# Patient Record
Sex: Male | Born: 1955 | Race: Black or African American | Hispanic: No | Marital: Single | Smoking: Current every day smoker
Health system: Southern US, Community
[De-identification: ages and names within clinical notes are randomized; demographics above are authoritative.]

## PROBLEM LIST (undated history)

## (undated) DIAGNOSIS — I10 Essential (primary) hypertension: Secondary | ICD-10-CM

## (undated) DIAGNOSIS — F0390 Unspecified dementia without behavioral disturbance: Secondary | ICD-10-CM

## (undated) DIAGNOSIS — G4730 Sleep apnea, unspecified: Secondary | ICD-10-CM

## (undated) DIAGNOSIS — F209 Schizophrenia, unspecified: Secondary | ICD-10-CM

## (undated) DIAGNOSIS — E039 Hypothyroidism, unspecified: Secondary | ICD-10-CM

## (undated) DIAGNOSIS — F79 Unspecified intellectual disabilities: Secondary | ICD-10-CM

---

## 2008-04-08 ENCOUNTER — Emergency Department: Payer: Self-pay | Admitting: Emergency Medicine

## 2010-03-21 ENCOUNTER — Emergency Department: Payer: Self-pay | Admitting: Emergency Medicine

## 2012-11-14 ENCOUNTER — Emergency Department: Payer: Self-pay | Admitting: Emergency Medicine

## 2012-11-14 LAB — DRUG SCREEN, URINE
Barbiturates, Ur Screen: NEGATIVE (ref ?–200)
Benzodiazepine, Ur Scrn: NEGATIVE (ref ?–200)
Cannabinoid 50 Ng, Ur ~~LOC~~: NEGATIVE (ref ?–50)
Cocaine Metabolite,Ur ~~LOC~~: NEGATIVE (ref ?–300)
Methadone, Ur Screen: NEGATIVE (ref ?–300)
Opiate, Ur Screen: NEGATIVE (ref ?–300)
Phencyclidine (PCP) Ur S: NEGATIVE (ref ?–25)
Tricyclic, Ur Screen: POSITIVE (ref ?–1000)

## 2012-11-14 LAB — TSH: Thyroid Stimulating Horm: 0.1 u[IU]/mL — ABNORMAL LOW

## 2012-11-14 LAB — CBC
MCH: 28.4 pg (ref 26.0–34.0)
MCHC: 33.8 g/dL (ref 32.0–36.0)
MCV: 84 fL (ref 80–100)
Platelet: 200 10*3/uL (ref 150–440)

## 2012-11-14 LAB — COMPREHENSIVE METABOLIC PANEL
Alkaline Phosphatase: 114 U/L (ref 50–136)
Anion Gap: 4 — ABNORMAL LOW (ref 7–16)
Chloride: 106 mmol/L (ref 98–107)
Co2: 29 mmol/L (ref 21–32)
Creatinine: 1.16 mg/dL (ref 0.60–1.30)
EGFR (African American): >60
Osmolality: 278 (ref 275–301)
Potassium: 3.6 mmol/L (ref 3.5–5.1)
Sodium: 139 mmol/L (ref 136–145)
Total Protein: 8.6 g/dL — ABNORMAL HIGH (ref 6.4–8.2)

## 2012-11-14 LAB — ETHANOL
Ethanol %: <0.003 % (ref 0.000–0.080)
Ethanol: <3 mg/dL
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
## 2015-03-21 ENCOUNTER — Emergency Department: Payer: Medicare Other

## 2015-03-21 ENCOUNTER — Encounter: Payer: Self-pay | Admitting: Emergency Medicine

## 2015-03-21 ENCOUNTER — Inpatient Hospital Stay
Admission: EM | Admit: 2015-03-21 | Discharge: 2015-03-24 | DRG: 812 | Payer: Medicare Other | Attending: Internal Medicine | Admitting: Internal Medicine

## 2015-03-21 DIAGNOSIS — F0390 Unspecified dementia without behavioral disturbance: Secondary | ICD-10-CM | POA: Diagnosis present

## 2015-03-21 DIAGNOSIS — K921 Melena: Secondary | ICD-10-CM | POA: Diagnosis present

## 2015-03-21 DIAGNOSIS — G4730 Sleep apnea, unspecified: Secondary | ICD-10-CM | POA: Diagnosis present

## 2015-03-21 DIAGNOSIS — F79 Unspecified intellectual disabilities: Secondary | ICD-10-CM | POA: Diagnosis present

## 2015-03-21 DIAGNOSIS — E861 Hypovolemia: Secondary | ICD-10-CM | POA: Diagnosis present

## 2015-03-21 DIAGNOSIS — Z8249 Family history of ischemic heart disease and other diseases of the circulatory system: Secondary | ICD-10-CM | POA: Diagnosis not present

## 2015-03-21 DIAGNOSIS — K625 Hemorrhage of anus and rectum: Secondary | ICD-10-CM | POA: Diagnosis present

## 2015-03-21 DIAGNOSIS — I10 Essential (primary) hypertension: Secondary | ICD-10-CM | POA: Diagnosis present

## 2015-03-21 DIAGNOSIS — I959 Hypotension, unspecified: Secondary | ICD-10-CM | POA: Diagnosis present

## 2015-03-21 DIAGNOSIS — R55 Syncope and collapse: Secondary | ICD-10-CM | POA: Diagnosis present

## 2015-03-21 DIAGNOSIS — F17210 Nicotine dependence, cigarettes, uncomplicated: Secondary | ICD-10-CM | POA: Diagnosis present

## 2015-03-21 DIAGNOSIS — E039 Hypothyroidism, unspecified: Secondary | ICD-10-CM | POA: Diagnosis present

## 2015-03-21 DIAGNOSIS — D62 Acute posthemorrhagic anemia: Secondary | ICD-10-CM | POA: Diagnosis present

## 2015-03-21 DIAGNOSIS — Z79899 Other long term (current) drug therapy: Secondary | ICD-10-CM

## 2015-03-21 HISTORY — DX: Essential (primary) hypertension: I10

## 2015-03-21 HISTORY — DX: Sleep apnea, unspecified: G47.30

## 2015-03-21 HISTORY — DX: Schizophrenia, unspecified: F20.9

## 2015-03-21 HISTORY — DX: Unspecified dementia, unspecified severity, without behavioral disturbance, psychotic disturbance, mood disturbance, and anxiety: F03.90

## 2015-03-21 HISTORY — DX: Hypothyroidism, unspecified: E03.9

## 2015-03-21 HISTORY — DX: Unspecified intellectual disabilities: F79

## 2015-03-21 LAB — COMPREHENSIVE METABOLIC PANEL
ALBUMIN: 2.9 g/dL — AB (ref 3.5–5.0)
ALT: 32 U/L (ref 17–63)
AST: 31 U/L (ref 15–41)
Alkaline Phosphatase: 49 U/L (ref 38–126)
Anion gap: 5 (ref 5–15)
BUN: 35 mg/dL — AB (ref 6–20)
CHLORIDE: 111 mmol/L (ref 101–111)
CO2: 28 mmol/L (ref 22–32)
CREATININE: 1.29 mg/dL — AB (ref 0.61–1.24)
Calcium: 8.4 mg/dL — ABNORMAL LOW (ref 8.9–10.3)
GFR calc Af Amer: >60 mL/min (ref >60)
GFR, EST NON AFRICAN AMERICAN: 59 mL/min — AB (ref >60)
Glucose, Bld: 136 mg/dL — ABNORMAL HIGH (ref 65–99)
Potassium: 3.5 mmol/L (ref 3.5–5.1)
SODIUM: 144 mmol/L (ref 135–145)
Total Bilirubin: 0.5 mg/dL (ref 0.3–1.2)
Total Protein: 5.5 g/dL — ABNORMAL LOW (ref 6.5–8.1)

## 2015-03-21 LAB — CBC
HCT: 23.4 % — ABNORMAL LOW (ref 40.0–52.0)
HEMOGLOBIN: 7.7 g/dL — AB (ref 13.0–18.0)
MCH: 28.5 pg (ref 26.0–34.0)
MCHC: 33.1 g/dL (ref 32.0–36.0)
MCV: 86.1 fL (ref 80.0–100.0)
Platelets: 136 10*3/uL — ABNORMAL LOW (ref 150–440)
RBC: 2.71 MIL/uL — ABNORMAL LOW (ref 4.40–5.90)
RDW: 12.5 % (ref 11.5–14.5)
WBC: 10.7 10*3/uL — ABNORMAL HIGH (ref 3.8–10.6)

## 2015-03-21 LAB — PROTIME-INR
INR: 1.24
Prothrombin Time: 15.8 seconds — ABNORMAL HIGH (ref 11.4–15.0)

## 2015-03-21 LAB — APTT: aPTT: 27 seconds (ref 24–36)

## 2015-03-21 LAB — ABO/RH: ABO/RH(D): B POS

## 2015-03-21 LAB — PREPARE RBC (CROSSMATCH)

## 2015-03-21 LAB — TROPONIN I

## 2015-03-21 MED ORDER — SODIUM CHLORIDE 0.9 % IV SOLN
INTRAVENOUS | Status: DC
  Administered 2015-03-21 – 2015-03-24 (×5): via INTRAVENOUS

## 2015-03-21 MED ORDER — OXYCODONE HCL 5 MG PO TABS: 5.0000 mg | ORAL_TABLET | ORAL | Status: DC | PRN

## 2015-03-21 MED ORDER — SODIUM CHLORIDE 0.9% FLUSH
3.0000 mL | Freq: Two times a day (BID) | INTRAVENOUS | Status: DC
  Administered 2015-03-22 – 2015-03-24 (×5): 3 mL via INTRAVENOUS

## 2015-03-21 MED ORDER — SODIUM CHLORIDE 0.9 % IV BOLUS (SEPSIS)
1000.0000 mL | Freq: Once | INTRAVENOUS | Status: AC
  Administered 2015-03-21: 1000 mL via INTRAVENOUS

## 2015-03-21 MED ORDER — ACETAMINOPHEN 650 MG RE SUPP: 650.0000 mg | Freq: Four times a day (QID) | RECTAL | Status: DC | PRN

## 2015-03-21 MED ORDER — SODIUM CHLORIDE 0.9 % IV SOLN
10.0000 mL/h | Freq: Once | INTRAVENOUS | Status: AC
  Administered 2015-03-22: 10 mL/h via INTRAVENOUS

## 2015-03-21 MED ORDER — MORPHINE SULFATE (PF) 2 MG/ML IV SOLN: 2.0000 mg | INTRAVENOUS | Status: DC | PRN

## 2015-03-21 MED ORDER — SODIUM CHLORIDE 0.9 % IV BOLUS (SEPSIS)
1000.0000 mL | Freq: Once | INTRAVENOUS | Status: AC
Start: 2015-03-22 — End: 2015-03-21
  Administered 2015-03-21: 1000 mL via INTRAVENOUS

## 2015-03-21 MED ORDER — ONDANSETRON HCL 4 MG PO TABS: 4.0000 mg | ORAL_TABLET | Freq: Four times a day (QID) | ORAL | Status: DC | PRN

## 2015-03-21 MED ORDER — ACETAMINOPHEN 325 MG PO TABS: 650.0000 mg | ORAL_TABLET | Freq: Four times a day (QID) | ORAL | Status: DC | PRN

## 2015-03-21 MED ORDER — ONDANSETRON HCL 4 MG/2ML IJ SOLN: 4.0000 mg | Freq: Four times a day (QID) | INTRAMUSCULAR | Status: DC | PRN

## 2015-03-21 NOTE — ED Notes (Signed)
Called pt's family, brandon, w/ pt's permission to give update.  Family member informed of pt's condition and plan to admit to CCU.  Brandon's phone number [phone]

## 2015-03-21 NOTE — H&P (Signed)
[facility] [place] NAME: Brandon Singh    MR#:  [id number]  DATE OF BIRTH:  April 14, 1955   DATE OF ADMISSION:  03/21/2015  PRIMARY CARE PHYSICIAN: Elba Barman, MD   REQUESTING/REFERRING PHYSICIAN: mcShane  CHIEF COMPLAINT:   Chief Complaint  Patient presents with  . Rectal Bleeding  . Loss of Consciousness    HISTORY OF PRESENT ILLNESS:  Brandon Singh  is a 60 y.o. male with a known history of hypothyroidism unspecified, mental retardation who is presenting from group home after syncopal episode. Apparently he had multiple episodes of maroon colored stools today followed by a syncopal episode subsequently sent to the Hospital further workup and evaluation he is initially hypotensive with systolic blood pressure in the 60s however fluid responsive. The patient is out provide meaningful information given mental status. He states only "I didn't feel myself and I would like a pancake"  PAST MEDICAL HISTORY:   Past Medical History  Diagnosis Date  . Hypertension   . Schizophrenia ([place])   . Mental retardation   . Hypothyroidism   . Dementia   . Sleep apnea     PAST SURGICAL HISTORY:  History reviewed. No pertinent past surgical history.  SOCIAL HISTORY:   Social History  Substance Use Topics  . Smoking status: Current Every Day Smoker -- 0.50 packs/day    Types: Cigarettes  . Smokeless tobacco: Not on file  . Alcohol Use: No    FAMILY HISTORY:   Family History  Problem Relation Age of Onset  . Hypertension Other     DRUG ALLERGIES:  No Known Allergies  REVIEW OF SYSTEMS:  REVIEW OF SYSTEMS: Unable to accurately assess given patient's mental status    MEDICATIONS AT HOME:   Prior to Admission medications   Medication Sig Start Date End Date Taking? Authorizing Provider  fluPHENAZine (PROLIXIN) 10 MG tablet Take 10 mg by mouth daily.   Yes Historical Provider, MD  fluPHENAZine (PROLIXIN) 10 MG tablet  Take 15 mg by mouth at bedtime.   Yes Historical Provider, MD  levothyroxine (SYNTHROID, LEVOTHROID) 112 MCG tablet Take 112 mcg by mouth daily before breakfast.   Yes Historical Provider, MD  QUEtiapine (SEROQUEL) 200 MG tablet Take 200 mg by mouth 2 (two) times daily.   Yes Historical Provider, MD  QUEtiapine (SEROQUEL) 400 MG tablet Take 400 mg by mouth at bedtime.   Yes Historical Provider, MD  sertraline (ZOLOFT) 100 MG tablet Take 150 mg by mouth daily.   Yes Historical Provider, MD  traZODone (DESYREL) 100 MG tablet Take 200 mg by mouth at bedtime.   Yes Historical Provider, MD      VITAL SIGNS:  Blood pressure 113/65, pulse 105, temperature 97.5 F (36.4 C), temperature source Oral, resp. rate 16, height 5\' 7"  (1.702 m), weight 140 lb (63.504 kg), SpO2 100 %.  PHYSICAL EXAMINATION:  VITAL SIGNS: Filed Vitals:   03/21/15 2245 03/21/15 2300  BP: 114/65 113/65  Pulse: 97 105  Temp:    Resp: 14 16   GENERAL:60 y.o.male currently in no acute distress.  HEAD: Normocephalic, atraumatic.  EYES: Pupils equal, round, reactive to light. Extraocular muscles intact. No scleral icterus.  MOUTH: Moist mucosal membrane. Dentition poor. No abscess noted.  EAR, NOSE, THROAT: Clear without exudates. No external lesions.  NECK: Supple. No thyromegaly. No nodules. No JVD.  PULMONARY: Clear to ascultation, without wheeze rails or rhonci. No use of accessory muscles, Good respiratory effort.  good air entry bilaterally CHEST: Nontender to palpation.  CARDIOVASCULAR: S1 and S2. Regular rate and rhythm. No murmurs, rubs, or gallops. No edema. Pedal pulses 2+ bilaterally.  GASTROINTESTINAL: Soft, nontender, nondistended. No masses. Positive bowel sounds. No hepatosplenomegaly.  MUSCULOSKELETAL: No swelling, clubbing, or edema. Range of motion full in all extremities.  NEUROLOGIC: Cranial nerves II through XII are intact. No gross focal neurological deficits. Sensation intact. Reflexes intact.  SKIN:  No ulceration, lesions, rashes, or cyanosis. Skin warm and dry. Turgor intact.  PSYCHIATRIC: Mood, affect within normal limits. The patient is awake, alert and oriented x 3. Insight, judgment intact.    LABORATORY PANEL:   CBC  Recent Labs Lab 03/21/15 2126  WBC 10.7*  HGB 7.7*  HCT 23.4*  PLT 136*   ------------------------------------------------------------------------------------------------------------------  Chemistries   Recent Labs Lab 03/21/15 2126  NA 144  K 3.5  CL 111  CO2 28  GLUCOSE 136*  BUN 35*  CREATININE 1.29*  CALCIUM 8.4*  AST 31  ALT 32  ALKPHOS 49  BILITOT 0.5   ------------------------------------------------------------------------------------------------------------------  Cardiac Enzymes  Recent Labs Lab 03/21/15 2126  TROPONINI <0.03   ------------------------------------------------------------------------------------------------------------------  RADIOLOGY:  Dg Chest Portable 1 View  03/21/2015  CLINICAL DATA:  Dizziness, syncope.  Rectal bleeding. EXAM: PORTABLE CHEST 1 VIEW COMPARISON:  None. FINDINGS: The heart size and mediastinal contours are within normal limits. Both lungs are clear. The visualized skeletal structures are unremarkable. IMPRESSION: No active disease. Electronically Signed   By: Rolm Baptise M.D.   On: 03/21/2015 21:42    EKG:   Orders placed or performed during the hospital encounter of 03/21/15  . ED EKG  . ED EKG    IMPRESSION AND PLAN:   60 year old African-American gentleman history of mental retardation presenting with multiple maroon colored stools with subsequent syncopal episode.  1. Bright red blood per rectum/GI bleed: Transfuse 1 unit packed red blood cells, trend CBC every 6 hours, consult gastroenterology 2. Hypothyroidism unspecified: Synthroid 3. Syncopal episode: Orthostasis secondary to acute blood loss, fluid responsive place on telemetry trend cardiac enzymes 4. Venous embolism  prophylactic: SCDs    All the records are reviewed and case discussed with ED provider. Management plans discussed with the patient, family and they are in agreement.  CODE STATUS: Full  TOTAL TIME TAKING CARE OF THIS PATIENT: 40 minutes.    Wynema Garoutte,  Karenann Cai.D on 03/21/2015 at 11:05 PM  Between 7am to 6pm - Pager - [phone]  After 6pm: House Pager: - [phone]  [facility]  Office  [phone]  CC: Primary care physician; Elba Barman, MD

## 2015-03-21 NOTE — ED Notes (Signed)
Pt arrived via EMS from group home for complaints of dizziness, syncope, and rectal bleeding. EMS reports 60/palp.

## 2015-03-21 NOTE — ED Provider Notes (Addendum)
[facility] Emergency Department Provider Note  ____________________________________________   I have reviewed the triage vital signs and the nursing notes.   HISTORY  Chief Complaint Rectal Bleeding and Loss of Consciousness    HPI Brandon Singh is a 60 y.o. male patient with a history of schizophrenia and limited mental capacity at baseline states that he has been having some bloody stools. Patient as a very limited historian. DMS he had a syncopal event and his pressure was 60 over palp. According to EMS he is at his mental baseline.  Past Medical History  Diagnosis Date  . Hypertension   . Schizophrenia ([place])   . Mental retardation   . Hypothyroidism   . Dementia   . Sleep apnea     There are no active problems to display for this patient.   History reviewed. No pertinent past surgical history.  Current Outpatient Rx  Name  Route  Sig  Dispense  Refill  . fluPHENAZine (PROLIXIN) 10 MG tablet   Oral   Take 10 mg by mouth daily.         . fluPHENAZine (PROLIXIN) 10 MG tablet   Oral   Take 15 mg by mouth at bedtime.         Marland Kitchen levothyroxine (SYNTHROID, LEVOTHROID) 112 MCG tablet   Oral   Take 112 mcg by mouth daily before breakfast.         . QUEtiapine (SEROQUEL) 200 MG tablet   Oral   Take 200 mg by mouth 2 (two) times daily.         . QUEtiapine (SEROQUEL) 400 MG tablet   Oral   Take 400 mg by mouth at bedtime.         . sertraline (ZOLOFT) 100 MG tablet   Oral   Take 150 mg by mouth daily.         . traZODone (DESYREL) 100 MG tablet   Oral   Take 200 mg by mouth at bedtime.           Allergies Review of patient's allergies indicates no known allergies.  History reviewed. No pertinent family history.  Social History Social History  Substance Use Topics  . Smoking status: Current Every Day Smoker -- 0.50 packs/day    Types: Cigarettes  . Smokeless tobacco: None  . Alcohol Use: No    Review of  Systems, please of the answers the patient gives me but it is a very limited history Constitutional: No fever/chills Eyes: No visual changes. ENT: No sore throat. No stiff neck no neck pain Cardiovascular: Denies chest pain. Respiratory: Denies shortness of breath. Gastrointestinal:   no vomiting.  No diarrhea.  No constipation. Genitourinary: Negative for dysuria. Musculoskeletal: Negative lower extremity swelling Skin: Negative for rash. Neurological: Negative for headaches, focal weakness or numbness. 10-point ROS otherwise negative.  ____________________________________________   PHYSICAL EXAM:  VITAL SIGNS: ED Triage Vitals  Enc Vitals Group     BP 03/21/15 2110 94/59 mmHg     Pulse Rate 03/21/15 2110 100     Resp 03/21/15 2110 16     Temp 03/21/15 2110 97.5 F (36.4 C)     Temp Source 03/21/15 2110 Oral     SpO2 03/21/15 2110 98 %     Weight 03/21/15 2110 140 lb (63.504 kg)     Height 03/21/15 2110 5\' 7"  (1.702 m)     Head Cir --      Peak Flow --  Pain Score 03/21/15 2114 0     Pain Loc --      Pain Edu? --      Excl. in [place]? --     Constitutional: Alert , in no acute distress Eyes: Conjunctivae are normal. PERRL. EOMI. Head: Atraumatic. Nose: No congestion/rhinnorhea. Mouth/Throat: Mucous membranes are moist.  Oropharynx non-erythematous. Neck: No stridor.   Nontender with no meningismus Cardiovascular: Normal rate, regular rhythm. Grossly normal heart sounds.  Good peripheral circulation. Respiratory: Normal respiratory effort.  No retractions. Lungs CTAB. Rectal exam, maroon bloody stool guaiac positive noted Abdominal: Soft and nontender. No distention. No guarding no rebound Back:  There is no focal tenderness or step off there is no midline tenderness there are no lesions noted. there is no CVA tenderness Musculoskeletal: No lower extremity tenderness. No joint effusions, no DVT signs strong distal pulses no edema Neurologic:  Normal speech and  language. No gross focal neurologic deficits are appreciated.  Skin:  Skin is warm, dry and intact. No rash noted.   ____________________________________________   LABS (all labs ordered are listed, but only abnormal results are displayed)  Labs Reviewed  CBC - Abnormal; Notable for the following:    WBC 10.7 (*)    RBC 2.71 (*)    Hemoglobin 7.7 (*)    HCT 23.4 (*)    Platelets 136 (*)    All other components within normal limits  COMPREHENSIVE METABOLIC PANEL - Abnormal; Notable for the following:    Glucose, Bld 136 (*)    BUN 35 (*)    Creatinine, Ser 1.29 (*)    Calcium 8.4 (*)    Total Protein 5.5 (*)    Albumin 2.9 (*)    GFR calc non Af Amer 59 (*)    All other components within normal limits  PROTIME-INR - Abnormal; Notable for the following:    Prothrombin Time 15.8 (*)    All other components within normal limits  TROPONIN I  APTT  TYPE AND SCREEN  PREPARE RBC (CROSSMATCH)  ABO/RH   ____________________________________________  EKG  I personally interpreted any EKGs ordered by me or triage Sinus rhythm rate 90 bpm no acute ST elevation no acute ST depression normal axis unremarkable EKG ____________________________________________  RADIOLOGY  I reviewed any imaging ordered by me or triage that were performed during my shift ____________________________________________   PROCEDURES  Procedure(s) performed: None  Critical Care performed: CRITICAL CARE Performed by: Schuyler Amor   Total critical care time: 40 minutes  Critical care time was exclusive of separately billable procedures and treating other patients.  Critical care was necessary to treat or prevent imminent or life-threatening deterioration.  Critical care was time spent personally by me on the following activities: development of treatment plan with patient and/or surrogate as well as nursing, discussions with consultants, evaluation of patient's response to treatment, examination  of patient, obtaining history from patient or surrogate, ordering and performing treatments and interventions, ordering and review of laboratory studies, ordering and review of radiographic studies, pulse oximetry and re-evaluation of patient's condition.   ____________________________________________   INITIAL IMPRESSION / ASSESSMENT AND PLAN / ED COURSE  Pertinent labs & imaging results that were available during my care of the patient were reviewed by me and considered in my medical decision making (see chart for details).  Patient with a GI bleed, blood pressure come up rapidly with IV fluid. We'll transfuse him. He has no abdominal pain do not think imaging is indicated. Patient will require admission.  Patient is not known to be on any anticoagulation. CBC is 7.7 and likely will go lower.Marland Kitchen  ----------------------------------------- 10:26 PM on 03/21/2015 -----------------------------------------  I did discuss with the admitting hospitalist who will accept the patient onto his service and agrees with management. Blood rate declines to allow me to transfuse the patient his own blood type here because of protocol. They state only on the floor can they do that. They are asking to transfuse non-crossmatched blood for this patient, O-, at this time given that his pressures are responded well I will defer giving him non-crossmatched emergency release blood since we already have a type and screen will see if he can get him upstairs again the blood type that he should have. ____________________________________________   FINAL CLINICAL IMPRESSION(S) / ED DIAGNOSES  Final diagnoses:  None     Schuyler Amor, MD 03/21/15 2222  Schuyler Amor, MD 03/21/15 [place], MD 03/21/15 [place], MD 03/21/15 2227

## 2015-03-21 NOTE — ED Notes (Signed)
Per Dr. Burlene Arnt, prefer crossmatched blood, per lab can only release O to ED, per Dr. Burlene Arnt, transfuse on floor with crossmatched blood.

## 2015-03-22 LAB — GLUCOSE, CAPILLARY: GLUCOSE-CAPILLARY: 100 mg/dL — AB (ref 65–99)

## 2015-03-22 LAB — PREPARE RBC (CROSSMATCH)

## 2015-03-22 LAB — CBC
HCT: 27.1 % — ABNORMAL LOW (ref 40.0–52.0)
Hemoglobin: 9 g/dL — ABNORMAL LOW (ref 13.0–18.0)
MCH: 28.3 pg (ref 26.0–34.0)
MCHC: 33.2 g/dL (ref 32.0–36.0)
MCV: 85.1 fL (ref 80.0–100.0)
PLATELETS: 128 10*3/uL — AB (ref 150–440)
RBC: 3.19 MIL/uL — ABNORMAL LOW (ref 4.40–5.90)
RDW: 13 % (ref 11.5–14.5)
WBC: 9.6 10*3/uL (ref 3.8–10.6)

## 2015-03-22 LAB — HEMOGLOBIN
HEMOGLOBIN: 8.7 g/dL — AB (ref 13.0–18.0)
Hemoglobin: 9.6 g/dL — ABNORMAL LOW (ref 13.0–18.0)
Hemoglobin: 7.9 g/dL — ABNORMAL LOW (ref 13.0–18.0)

## 2015-03-22 LAB — TROPONIN I
Troponin I: <0.03 ng/mL (ref <0.031)
Troponin I: <0.03 ng/mL
Troponin I: <0.03 ng/mL
Troponin I: <0.03 ng/mL

## 2015-03-22 LAB — MRSA PCR SCREENING: MRSA by PCR: POSITIVE — AB

## 2015-03-22 MED ORDER — SODIUM CHLORIDE 0.9 % IV SOLN
Freq: Once | INTRAVENOUS | Status: AC
Start: 2015-03-22 — End: 2015-03-22
  Administered 2015-03-22: 02:00:00 via INTRAVENOUS

## 2015-03-22 MED ORDER — TRAZODONE HCL 100 MG PO TABS
200.0000 mg | ORAL_TABLET | Freq: Every day | ORAL | Status: DC
Start: 2015-03-22 — End: 2015-03-24
  Administered 2015-03-22 – 2015-03-23 (×3): 200 mg via ORAL
  Filled 2015-03-22 (×3): qty 2

## 2015-03-22 MED ORDER — CHLORHEXIDINE GLUCONATE CLOTH 2 % EX PADS
6.0000 | MEDICATED_PAD | Freq: Every day | CUTANEOUS | Status: DC
  Administered 2015-03-23 – 2015-03-24 (×2): 6 via TOPICAL

## 2015-03-22 MED ORDER — QUETIAPINE FUMARATE 100 MG PO TABS
200.0000 mg | ORAL_TABLET | Freq: Two times a day (BID) | ORAL | Status: DC
  Administered 2015-03-22 – 2015-03-24 (×6): 200 mg via ORAL
  Filled 2015-03-22 (×6): qty 2

## 2015-03-22 MED ORDER — FLUPHENAZINE HCL 5 MG PO TABS
15.0000 mg | ORAL_TABLET | Freq: Every day | ORAL | Status: DC
  Administered 2015-03-22 – 2015-03-23 (×3): 15 mg via ORAL
  Filled 2015-03-22 (×4): qty 3

## 2015-03-22 MED ORDER — QUETIAPINE FUMARATE 300 MG PO TABS
400.0000 mg | ORAL_TABLET | Freq: Every day | ORAL | Status: DC
  Administered 2015-03-22 – 2015-03-23 (×3): 400 mg via ORAL
  Filled 2015-03-22: qty 1
  Filled 2015-03-22: qty 4
  Filled 2015-03-22: qty 1

## 2015-03-22 MED ORDER — LEVOTHYROXINE SODIUM 112 MCG PO TABS
112.0000 ug | ORAL_TABLET | Freq: Every day | ORAL | Status: DC
  Administered 2015-03-22 – 2015-03-24 (×3): 112 ug via ORAL
  Filled 2015-03-22 (×3): qty 1

## 2015-03-22 MED ORDER — FLUPHENAZINE HCL 5 MG PO TABS
10.0000 mg | ORAL_TABLET | Freq: Every day | ORAL | Status: DC
Start: 2015-03-22 — End: 2015-03-24
  Administered 2015-03-22 – 2015-03-24 (×3): 10 mg via ORAL
  Filled 2015-03-22 (×4): qty 2

## 2015-03-22 MED ORDER — CETYLPYRIDINIUM CHLORIDE 0.05 % MT LIQD
7.0000 mL | Freq: Two times a day (BID) | OROMUCOSAL | Status: DC
  Administered 2015-03-22 – 2015-03-24 (×5): 7 mL via OROMUCOSAL

## 2015-03-22 MED ORDER — PANTOPRAZOLE SODIUM 40 MG IV SOLR
40.0000 mg | Freq: Two times a day (BID) | INTRAVENOUS | Status: DC
  Administered 2015-03-22 – 2015-03-24 (×5): 40 mg via INTRAVENOUS
  Filled 2015-03-22 (×5): qty 40

## 2015-03-22 MED ORDER — SERTRALINE HCL 50 MG PO TABS
150.0000 mg | ORAL_TABLET | Freq: Every day | ORAL | Status: DC
  Administered 2015-03-22 – 2015-03-24 (×3): 150 mg via ORAL
  Filled 2015-03-22 (×3): qty 3

## 2015-03-22 MED ORDER — MUPIROCIN 2 % EX OINT
1.0000 "application " | TOPICAL_OINTMENT | Freq: Two times a day (BID) | CUTANEOUS | Status: DC
  Administered 2015-03-22 – 2015-03-24 (×5): 1 via NASAL
  Filled 2015-03-22: qty 22

## 2015-03-22 NOTE — Progress Notes (Signed)
GI Note:  Poor historian.   He does declines colonosocpy saying he does not need it.  Unable to state the he was having rectal bleeding.   Even with consent from [facility], getting him to drink colon prep would be prohibitively difficult.     Recommend monitoring of Hgb and watching for further bleeding.  If his bleeding stops and Hgb stable, can have him f/u in clinic with HCPOA to see if he would be willing to undergo colonoscopy.

## 2015-03-22 NOTE — Progress Notes (Signed)
[facility] NAME: Brandon Singh    MR#:  [id number]  DATE OF BIRTH:  [birth year]  SUBJECTIVE:  CHIEF COMPLAINT:   Chief Complaint  Patient presents with  . Rectal Bleeding  . Loss of Consciousness   Mild headache. No abd pain/vomiting/bloody BM  REVIEW OF SYSTEMS:    Review of Systems  Constitutional: Negative for fever and chills.  HENT: Negative for sore throat.   Eyes: Negative for blurred vision, double vision and pain.  Respiratory: Negative for cough, hemoptysis, shortness of breath and wheezing.   Cardiovascular: Negative for chest pain, palpitations, orthopnea and leg swelling.  Gastrointestinal: Negative for heartburn, nausea, vomiting, abdominal pain, diarrhea and constipation.  Genitourinary: Negative for dysuria and hematuria.  Musculoskeletal: Negative for back pain and joint pain.  Skin: Negative for rash.  Neurological: Positive for headaches. Negative for sensory change, speech change and focal weakness.  Endo/Heme/Allergies: Does not bruise/bleed easily.  Psychiatric/Behavioral: Positive for depression and memory loss. The patient is not nervous/anxious.       DRUG ALLERGIES:  No Known Allergies  VITALS:  Blood pressure 114/73, pulse 102, temperature 98.3 F (36.8 C), temperature source Axillary, resp. rate 15, height 5\' 7"  (1.702 m), weight 80.7 kg (177 lb 14.6 oz), SpO2 100 %.  PHYSICAL EXAMINATION:   Physical Exam  GENERAL:  60 y.o.-year-old patient lying in the bed with no acute distress.  EYES: Pupils equal, round, reactive to light and accommodation. No scleral icterus. Extraocular muscles intact.  HEENT: Head atraumatic, normocephalic. Oropharynx and nasopharynx clear.  NECK:  Supple, no jugular venous distention. No thyroid enlargement, no tenderness.  LUNGS: Normal breath sounds bilaterally, no wheezing, rales, rhonchi. No use of accessory muscles of respiration.  CARDIOVASCULAR:  S1, S2 normal. No murmurs, rubs, or gallops.  ABDOMEN: Soft, nontender, nondistended. Bowel sounds present. No organomegaly or mass.  EXTREMITIES: No cyanosis, clubbing or edema b/l.    NEUROLOGIC: Cranial nerves II through XII are intact. No focal Motor or sensory deficits b/l.   PSYCHIATRIC: The patient is alert and awake. Flat affect SKIN: No obvious rash, lesion, or ulcer.    LABORATORY PANEL:   CBC  Recent Labs Lab 03/22/15 0353  WBC 9.6  HGB 9.0*  HCT 27.1*  PLT 128*   ------------------------------------------------------------------------------------------------------------------  Chemistries   Recent Labs Lab 03/21/15 2126  NA 144  K 3.5  CL 111  CO2 28  GLUCOSE 136*  BUN 35*  CREATININE 1.29*  CALCIUM 8.4*  AST 31  ALT 32  ALKPHOS 49  BILITOT 0.5   ------------------------------------------------------------------------------------------------------------------  Cardiac Enzymes  Recent Labs Lab 03/22/15 0359  TROPONINI <0.03   ------------------------------------------------------------------------------------------------------------------  RADIOLOGY:  Dg Chest Portable 1 View  03/21/2015  CLINICAL DATA:  Dizziness, syncope.  Rectal bleeding. EXAM: PORTABLE CHEST 1 VIEW COMPARISON:  None. FINDINGS: The heart size and mediastinal contours are within normal limits. Both lungs are clear. The visualized skeletal structures are unremarkable. IMPRESSION: No active disease. Electronically Signed   By: Rolm Baptise M.D.   On: 03/21/2015 21:42     ASSESSMENT AND PLAN:   60 year old African-American gentleman history of mental retardation presented with multiple maroon colored stools with subsequent syncopal episode  1. Bright red blood per rectum/GI bleed Serial Hb Q 6 hrs S/P 1 unit PRBC Consulted GI Protonix IV BID Transfuse to keep Hb >8  2. Hypothyroidism unspecified: Synthroid  3. Syncopal episodes due to hypovolemia of blood loss  4.  Venous embolism  prophylactic: SCDs No Heparin/Lovenox   All the records are reviewed and case discussed with Care Management/Social Workerr. Management plans discussed with the patient, family and they are in agreement.  CODE STATUS: FULL  DVT Prophylaxis: SCDs  TOTAL TIME TAKING CARE OF THIS PATIENT: 35 minutes.   POSSIBLE D/C IN 2-3 DAYS, DEPENDING ON CLINICAL CONDITION.   Hillary Bow R M.D on 03/22/2015 at 9:58 AM  Between 7am to 6pm - Pager - [phone]  After 6pm go to [email or web address] - password [email or web address] [facility]  Office  [phone]  CC: Primary care physician; Elba Barman, MD    Note: This dictation was prepared with Dragon dictation along with smaller phrase technology. Any transcriptional errors that result from this process are unintentional.

## 2015-03-22 NOTE — Progress Notes (Signed)
Patient transferring to [facility] with a med-surg and telemetry status. Report has been given to receiving nurse and patient is aware/agreeable to transfer. All belongings transferred with patient.

## 2015-03-22 NOTE — Progress Notes (Signed)
Spoke w/ Dr. Lavetta Nielsen as there were different orders for blood for a total of 3 units.  Pt.'s hgb: 7.7.  Dr. Lavetta Nielsen stated 1 unit was sufficient.

## 2015-03-22 NOTE — Consult Note (Signed)
GI Inpatient Consult Note  Reason for Consult: LGI bleed   Attending Requesting Consult: Sudini  History of Present Illness: Brandon Singh is a 60 y.o. male with MR  presenting for evaluation of rectal bleeding.He is a poor historian and is unable to give me any history.  He denies having any rectal bleeding or seeing any blood in his stools.  He denies any abdominal pain nausea vomiting fevers or chills.  Per the notes, he had several episodes of maroon stool at his group home.  He also apparently had a syncopal episode at his group home.  He was brought to the emergency room and was initially found to have a systolic blood pressure of 60.  Given IV fluid in the emergency room and his blood pressure improved significantly.  He was initially admitted to the intensive care unit but was quite stable and was transferred to the medical floor  I did discuss the possibility of undergoing a colonoscopy with him.  He stated that he did not need a colonoscopy that he would not drink the prep solution.  He did not understand that he had even had rectal bleeding.  Past Medical History:  Past Medical History  Diagnosis Date  . Hypertension   . Schizophrenia ([place])   . Mental retardation   . Hypothyroidism   . Dementia   . Sleep apnea     Problem List: Patient Active Problem List   Diagnosis Date Noted  . Bright red blood per rectum 03/21/2015  . Syncope 03/21/2015    Past Surgical History: History reviewed. No pertinent past surgical history.  Allergies: No Known Allergies  Home Medications: Prescriptions prior to admission  Medication Sig Dispense Refill Last Dose  . fluPHENAZine (PROLIXIN) 10 MG tablet Take 10 mg by mouth daily.   03/20/2015 at Unknown time  . fluPHENAZine (PROLIXIN) 10 MG tablet Take 15 mg by mouth at bedtime.   03/20/2015 at Unknown time  . levothyroxine (SYNTHROID, LEVOTHROID) 112 MCG tablet Take 112 mcg by mouth daily before breakfast.   03/20/2015 at Unknown time  .  QUEtiapine (SEROQUEL) 200 MG tablet Take 200 mg by mouth 2 (two) times daily.   03/20/2015 at Unknown time  . QUEtiapine (SEROQUEL) 400 MG tablet Take 400 mg by mouth at bedtime.   03/20/2015 at Unknown time  . sertraline (ZOLOFT) 100 MG tablet Take 150 mg by mouth daily.   03/20/2015 at Unknown time  . traZODone (DESYREL) 100 MG tablet Take 200 mg by mouth at bedtime.   03/20/2015 at Unknown time   Home medication reconciliation was NOT completed with the patient.   Scheduled Inpatient Medications:   . antiseptic oral rinse  7 mL Mouth Rinse BID  . [START ON 03/23/2015] Chlorhexidine Gluconate Cloth  6 each Topical Q0600  . fluPHENAZine  10 mg Oral Daily  . fluPHENAZine  15 mg Oral QHS  . levothyroxine  112 mcg Oral QAC breakfast  . mupirocin ointment  1 application Nasal BID  . pantoprazole (PROTONIX) IV  40 mg Intravenous Q12H  . QUEtiapine  200 mg Oral BID  . QUEtiapine  400 mg Oral QHS  . sertraline  150 mg Oral Daily  . sodium chloride flush  3 mL Intravenous Q12H  . traZODone  200 mg Oral QHS    Continuous Inpatient Infusions:   . sodium chloride 50 mL/hr at 03/22/15 1459    PRN Inpatient Medications:  acetaminophen **OR** acetaminophen, morphine injection, ondansetron **OR** ondansetron (ZOFRAN) IV, oxyCODONE  Family History: family history includes Hypertension in his other.  He doen't know fam hx.   Social History:   reports that he has been smoking Cigarettes.  He has been smoking about 0.50 packs per day. He does not have any smokeless tobacco history on file. He reports that he does not drink alcohol or use illicit drugs.   Review of Systems: Cannot obtain, he is not aware of his current situation or main problem.    Physical Examination: BP 115/63 mmHg  Pulse 102  Temp(Src) 98.4 F (36.9 C) (Oral)  Resp 14  Ht 5\' 7"  (1.702 m)  Wt 80.7 kg (177 lb 14.6 oz)  BMI 27.86 kg/m2  SpO2 97% Gen: NAD, alert and oriented x 2 Neck: supple, no JVD or thyromegaly Chest:  CTA bilaterally, no wheezes, crackles, or other adventitious sounds CV: RRR, no m/g/c/r Abd: soft, NT, ND, +BS in all four quadrants; no HSM, guarding, ridigity, or rebound tenderness Ext: no edema, well perfused with 2+ pulses, Skin: no rash or lesions noted Lymph: no LAD  Data: Lab Results  Component Value Date   WBC 9.6 03/22/2015   HGB 8.7* 03/22/2015   HCT 27.1* 03/22/2015   MCV 85.1 03/22/2015   PLT 128* 03/22/2015    Recent Labs Lab 03/21/15 2126 03/22/15 0353 03/22/15 1007  HGB 7.7* 9.0* 8.7*   Lab Results  Component Value Date   NA 144 03/21/2015   K 3.5 03/21/2015   CL 111 03/21/2015   CO2 28 03/21/2015   BUN 35* 03/21/2015   CREATININE 1.29* 03/21/2015   Lab Results  Component Value Date   ALT 32 03/21/2015   AST 31 03/21/2015   ALKPHOS 49 03/21/2015   BILITOT 0.5 03/21/2015    Recent Labs Lab 03/21/15 2126  APTT 27  INR 1.24   Assessment/Plan: Brandon Singh is a 60 y.o. male With mental retardation admitted for rectal bleeding and syncope at his group home.  Was also significantly hypotensive in the emergency room upon presentation.  He has been hemodynamically stable here in the hospital and has not had any further rectal bleeding.  He is unwilling to undergo a colonoscopy or drink colonoscopy prep.  Even if we got consistent from his legal guardian it would be quite a struggle to get him to drink any of the prep, let alone get a decent cleanout.  Recommendations: - monitor Hgb, hemodynamics - obtain tagged scan for further rectal bleeding - colonoscopy prep would not be feasible without his willing participation.  Thank you for the consult. Please call with questions or concerns.  REIN, Grace Blight, MD

## 2015-03-22 NOTE — Progress Notes (Signed)
eLink Physician-Brief Progress Note Patient Name: Brandon Singh DOB: [birth year] MRN: [id number]   Date of Service  03/22/2015  HPI/Events of Note  33 M resident of group home with mental retardation and hypothyroid presenting with rectal bleeding and syncope.  Initially hypotensive but responded to fluids.  Now with BP of 117/83 (94), HR of 93 and sats of 100%.  In not respiratory distress.  Serial H/H and GI consult planned.  eICU Interventions  Plan of care per primary admitting team. Continue to monitor via [facility]     Intervention Category Evaluation Type: New Patient Evaluation  Anda Sobotta 03/22/2015, 1:19 AM

## 2015-03-23 LAB — HEMOGLOBIN
HEMOGLOBIN: 8.6 g/dL — AB (ref 13.0–18.0)
Hemoglobin: 8.4 g/dL — ABNORMAL LOW (ref 13.0–18.0)

## 2015-03-23 NOTE — Progress Notes (Signed)
GI Inpatient Follow-up Note  Patient Identification: Brandon Singh is a 60 y.o. male with rectal bleeding  Subjective:  No further bleeding since admission. Hgb now stable. Denies abd pain, n/v, f/c, further rectal bleeding.   Scheduled Inpatient Medications:  . antiseptic oral rinse  7 mL Mouth Rinse BID  . Chlorhexidine Gluconate Cloth  6 each Topical Q0600  . fluPHENAZine  10 mg Oral Daily  . fluPHENAZine  15 mg Oral QHS  . levothyroxine  112 mcg Oral QAC breakfast  . mupirocin ointment  1 application Nasal BID  . pantoprazole (PROTONIX) IV  40 mg Intravenous Q12H  . QUEtiapine  200 mg Oral BID  . QUEtiapine  400 mg Oral QHS  . sertraline  150 mg Oral Daily  . sodium chloride flush  3 mL Intravenous Q12H  . traZODone  200 mg Oral QHS    Continuous Inpatient Infusions:   . sodium chloride 50 mL/hr at 03/23/15 1007    PRN Inpatient Medications:  acetaminophen **OR** acetaminophen, morphine injection, ondansetron **OR** ondansetron (ZOFRAN) IV, oxyCODONE  Review of Systems: Constitutional: Weight is stable.  Eyes: No changes in vision. ENT: No oral lesions, sore throat.  GI: see HPI.  Heme/Lymph: No easy bruising.  CV: No chest pain.  GU: No hematuria.  Integumentary: No rashes.  Neuro: No headaches.  Psych: No depression/anxiety.  Endocrine: No heat/cold intolerance.  Allergic/Immunologic: No urticaria.  Resp: No cough, SOB.  Musculoskeletal: No joint swelling.    Physical Examination: BP 118/71 mmHg  Pulse 88  Temp(Src) 98.1 F (36.7 C) (Oral)  Resp 19  Ht 5\' 7"  (1.702 m)  Wt 80.7 kg (177 lb 14.6 oz)  BMI 27.86 kg/m2  SpO2 97% Gen: NAD, alert and oriented x 1,  Neck: supple, no JVD or thyromegaly Chest: CTA bilaterally, no wheezes, crackles, or other adventitious sounds CV: RRR, no m/g/c/r Abd: soft, NT, ND, +BS in all four quadrants; no HSM, guarding, ridigity, or rebound tenderness Ext: no edema, well perfused with 2+ pulses, Skin: no rash or  lesions noted Lymph: no LAD  Data: Lab Results  Component Value Date   WBC 9.6 03/22/2015   HGB 8.4* 03/23/2015   HCT 27.1* 03/22/2015   MCV 85.1 03/22/2015   PLT 128* 03/22/2015    Recent Labs Lab 03/22/15 2209 03/23/15 0439 03/23/15 1651  HGB 7.9* 8.6* 8.4*   Lab Results  Component Value Date   NA 144 03/21/2015   K 3.5 03/21/2015   CL 111 03/21/2015   CO2 28 03/21/2015   BUN 35* 03/21/2015   CREATININE 1.29* 03/21/2015   Lab Results  Component Value Date   ALT 32 03/21/2015   AST 31 03/21/2015   ALKPHOS 49 03/21/2015   BILITOT 0.5 03/21/2015    Recent Labs Lab 03/21/15 2126  APTT 27  INR 1.24   Assessment/Plan: Mr. Prideaux is a 60 y.o. male with rectal bleeding, now resolved and Hgb stable.  Would ideally do colonoscopy while inpt but he is still not willing to undergo colonoscopy so prep would be extremely difficult even if legal guardian consented.    Recommendations: - safe for d/c once Hgb stable - f/u in GI clinic to reconsider colonoscopy, accompanied by legal guardian.   Please call with questions or concerns.  Pecolia Marando, Grace Blight, MD

## 2015-03-23 NOTE — NC FL2 (Signed)
  [place] LEVEL OF CARE SCREENING TOOL     IDENTIFICATION  Patient Name: Brandon Singh Birthdate: 02-17-1956 Sex: male Admission Date (Current Location): 03/21/2015  [place] and [place] Number:  Selena Lesser [id number] [place] and Address:  [facility], [address] [place], [place] [address]      Provider Number: [id number]  Attending Physician Name and Address:  Hillary Bow, MD  Relative Name and Phone Number:       Current Level of Care: Hospital Recommended Level of Care: Other (Comment) (group home) Prior Approval Number:    Date Approved/Denied:   PASRR Number:    Discharge Plan: Other (Comment) (Group Home)    Current Diagnoses: Patient Active Problem List   Diagnosis Date Noted  . Bright red blood per rectum 03/21/2015  . Syncope 03/21/2015    Orientation RESPIRATION BLADDER Height & Weight    Self, Situation, Place  Normal Continent 5\' 7"  (170.2 cm) 177 lbs.  BEHAVIORAL SYMPTOMS/MOOD NEUROLOGICAL BOWEL NUTRITION STATUS      Continent Diet (Regular)  AMBULATORY STATUS COMMUNICATION OF NEEDS Skin   Supervision Verbally Normal                       Personal Care Assistance Level of Assistance  Bathing, Feeding, Dressing Bathing Assistance: Independent Feeding assistance: Independent Dressing Assistance: Independent     Functional Limitations Info  Sight, Hearing, Speech Sight Info: Adequate Hearing Info: Adequate Speech Info: Adequate    SPECIAL CARE FACTORS FREQUENCY                       Contractures      Additional Factors Info  Allergies   Allergies Info: NKA           DISCHARGE MEDICATIONS:   Current Discharge Medication List    START taking these medications   Details  ferrous sulfate 325 (65 FE) MG tablet Take 1 tablet (325 mg total) by mouth 2 (two) times daily with a meal. Qty: 180 tablet, Refills: 0    pantoprazole (PROTONIX) 40 MG tablet Take 1  tablet (40 mg total) by mouth daily. Qty: 30 tablet, Refills: 0      CONTINUE these medications which have NOT CHANGED   Details  !! fluPHENAZine (PROLIXIN) 10 MG tablet Take 10 mg by mouth daily.    !! fluPHENAZine (PROLIXIN) 10 MG tablet Take 15 mg by mouth at bedtime.    levothyroxine (SYNTHROID, LEVOTHROID) 112 MCG tablet Take 112 mcg by mouth daily before breakfast.    !! QUEtiapine (SEROQUEL) 200 MG tablet Take 200 mg by mouth 2 (two) times daily.    !! QUEtiapine (SEROQUEL) 400 MG tablet Take 400 mg by mouth at bedtime.    sertraline (ZOLOFT) 100 MG tablet Take 150 mg by mouth daily.    traZODone (DESYREL) 100 MG tablet Take 200 mg by mouth at bedtime.    !! - Potential duplicate medications found. Please discuss with provider.        Discharge Medications: Please see discharge summary for a list of discharge medications.  Relevant Imaging Results:  Relevant Lab Results:   Additional Information [id number]  Maurine Cane, LCSW

## 2015-03-23 NOTE — Clinical Social Work Note (Signed)
Clinical Social Work Assessment  Patient Details  Name: Brandon Singh MRN: [id number] Date of Birth: 09-03-1955  Date of referral:  03/23/15               Reason for consult:   (from [facility] group home  [phone])                Permission sought to share information with:  Facility Art therapist granted to share information::     Name::        Agency::     Relationship::     Contact Information:     Housing/Transportation Living arrangements for the past 2 months:  Group Home Source of Information:  Medical Team, Facility Patient Interpreter Needed:    Criminal Activity/Legal Involvement Pertinent to Current Situation/Hospitalization:    Significant Relationships:  Warehouse manager, Siblings Lives with:  Facility Resident Do you feel safe going back to the place where you live?    Need for family participation in patient care:  Yes (Comment)  Care giving concerns:  None at this time   Facilities manager / plan:  Holiday representative (CSW) consult, patient is from a group home.   Patient was alert, oriented to self and place.  (Additional Information provided by care taker  Erlene Quan [phone] at the group home).  CSW introduced self and explained role of CSW department.   Patient currently lives at [place].  Has lived there for about four years, the plan is for him to return at discharge. When at the group home patient does not use any assistance devices when he walks.   Patient's family support system is his brother Wille Glaser, who lives in [place].  Per group home Wille Glaser is the legal responsible person and makes decisions for patient.   The group home will transport patient back when he is medically ready to discharge.    CSW will complete FL2, in anticipation of patient returning to his group home at discharge.  Employment status:  Disabled (Comment on whether or not currently receiving Disability) Insurance information:  Medicare PT  Recommendations:  Not assessed at this time Information / Referral to community resources:  Other (Comment Required) (none at this time)  Patient/Family's Response to care:  Patient has been cooperative.  Patient/Family's Understanding of and Emotional Response to Diagnosis, Current Treatment, and Prognosis:  Patient and group home understands that patient is under continued medical work up at this time.  Once medically stable he will discharge back to his group home.  Emotional Assessment Appearance:  Appears stated age Attitude/Demeanor/Rapport:    Affect (typically observed):  Pleasant, Calm Orientation:  Oriented to Self, Oriented to Place, Oriented to Situation Alcohol / Substance use:  Tobacco Use Psych involvement (Current and /or in the community):  No (Comment)  Discharge Needs  Concerns to be addressed:  Care Coordination, Discharge Planning Concerns Readmission within the last 30 days:  No Current discharge risk:  Chronically ill, Cognitively Impaired Barriers to Discharge:  Continued Medical Work up   Maurine Cane, LCSW 03/23/2015, 4:33 PM

## 2015-03-23 NOTE — Progress Notes (Signed)
[place] [facility] [place] NAME: Brandon Singh    MR#:  [id number]  DATE OF BIRTH:  [birth year]  SUBJECTIVE:  CHIEF COMPLAINT:   Chief Complaint  Patient presents with  . Rectal Bleeding  . Loss of Consciousness   No abd pain/vomiting/bloody BM.  REVIEW OF SYSTEMS:    Review of Systems  Constitutional: Negative for fever and chills.  HENT: Negative for sore throat.   Eyes: Negative for blurred vision, double vision and pain.  Respiratory: Negative for cough, hemoptysis, shortness of breath and wheezing.   Cardiovascular: Negative for chest pain, palpitations, orthopnea and leg swelling.  Gastrointestinal: Negative for heartburn, nausea, vomiting, abdominal pain, diarrhea and constipation.  Genitourinary: Negative for dysuria and hematuria.  Musculoskeletal: Negative for back pain and joint pain.  Skin: Negative for rash.  Neurological: Positive for headaches. Negative for sensory change, speech change and focal weakness.  Endo/Heme/Allergies: Does not bruise/bleed easily.  Psychiatric/Behavioral: Positive for depression and memory loss. The patient is not nervous/anxious.       DRUG ALLERGIES:  No Known Allergies  VITALS:  Blood pressure 118/71, pulse 88, temperature 98.1 F (36.7 C), temperature source Oral, resp. rate 19, height 5\' 7"  (1.702 m), weight 80.7 kg (177 lb 14.6 oz), SpO2 97 %.  PHYSICAL EXAMINATION:   Physical Exam  GENERAL:  60 y.o.-year-old patient lying in the bed with no acute distress.  EYES: Pupils equal, round, reactive to light and accommodation. No scleral icterus. Extraocular muscles intact.  HEENT: Head atraumatic, normocephalic. Oropharynx and nasopharynx clear.  NECK:  Supple, no jugular venous distention. No thyroid enlargement, no tenderness.  LUNGS: Normal breath sounds bilaterally, no wheezing, rales, rhonchi. No use of accessory muscles of respiration.  CARDIOVASCULAR: S1, S2 normal. No  murmurs, rubs, or gallops.  ABDOMEN: Soft, nontender, nondistended. Bowel sounds present. No organomegaly or mass.  EXTREMITIES: No cyanosis, clubbing or edema b/l.    NEUROLOGIC: Cranial nerves II through XII are intact. No focal Motor or sensory deficits b/l.   PSYCHIATRIC: The patient is alert and awake. Flat affect SKIN: No obvious rash, lesion, or ulcer.    LABORATORY PANEL:   CBC  Recent Labs Lab 03/22/15 0353  03/23/15 0439  WBC 9.6  --   --   HGB 9.0*  < > 8.6*  HCT 27.1*  --   --   PLT 128*  --   --   < > = values in this interval not displayed. ------------------------------------------------------------------------------------------------------------------  Chemistries   Recent Labs Lab 03/21/15 2126  NA 144  K 3.5  CL 111  CO2 28  GLUCOSE 136*  BUN 35*  CREATININE 1.29*  CALCIUM 8.4*  AST 31  ALT 32  ALKPHOS 49  BILITOT 0.5   ------------------------------------------------------------------------------------------------------------------  Cardiac Enzymes  Recent Labs Lab 03/22/15 2209  TROPONINI <0.03   ------------------------------------------------------------------------------------------------------------------  RADIOLOGY:  Dg Chest Portable 1 View  03/21/2015  CLINICAL DATA:  Dizziness, syncope.  Rectal bleeding. EXAM: PORTABLE CHEST 1 VIEW COMPARISON:  None. FINDINGS: The heart size and mediastinal contours are within normal limits. Both lungs are clear. The visualized skeletal structures are unremarkable. IMPRESSION: No active disease. Electronically Signed   By: Rolm Baptise M.D.   On: 03/21/2015 21:42     ASSESSMENT AND PLAN:   60 year old African-American gentleman history of mental retardation presented with multiple maroon colored stools with subsequent syncopal episode  1. Bright red blood per rectum/GI bleed Will check Hb again tomorrow S/P 1 unit  PRBC Consulted GI and suggested OP w/u if Hb stable Protonix IV  BID Transfuse to keep Hb >8  2. Hypothyroidism unspecified: Synthroid  3. Syncopal episodes due to hypovolemia of blood loss  4. Venous embolism prophylactic: SCDs No Heparin/Lovenox   All the records are reviewed and case discussed with Care Management/Social Workerr. Management plans discussed with the patient, family and they are in agreement.  CODE STATUS: FULL  DVT Prophylaxis: SCDs  TOTAL TIME TAKING CARE OF THIS PATIENT: 30 minutes.   POSSIBLE D/C IN 1-2 DAYS, DEPENDING ON CLINICAL CONDITION.   Hillary Bow R M.D on 03/23/2015 at 12:56 PM  Between 7am to 6pm - Pager - [phone]  After 6pm go to [email or web address] - password [email or web address] [facility]  Office  [phone]  CC: Primary care physician; Elba Barman, MD    Note: This dictation was prepared with Dragon dictation along with smaller phrase technology. Any transcriptional errors that result from this process are unintentional.

## 2015-03-24 LAB — TYPE AND SCREEN
ABO/RH(D): B POS
ANTIBODY SCREEN: NEGATIVE
UNIT DIVISION: 0
Unit division: 0
Unit division: 0

## 2015-03-24 LAB — HEMOGLOBIN: Hemoglobin: 8.1 g/dL — ABNORMAL LOW (ref 13.0–18.0)

## 2015-03-24 MED ORDER — FERROUS SULFATE 325 (65 FE) MG PO TABS: 325.0000 mg | ORAL_TABLET | Freq: Two times a day (BID) | ORAL | Status: AC

## 2015-03-24 MED ORDER — PANTOPRAZOLE SODIUM 40 MG PO TBEC: 40.0000 mg | DELAYED_RELEASE_TABLET | Freq: Every day | ORAL | Status: DC

## 2015-03-24 NOTE — Discharge Instructions (Signed)
°  DIET:  °Regular diet ° °DISCHARGE CONDITION:  °Stable ° °ACTIVITY:  °Activity as tolerated ° °OXYGEN:  °Home Oxygen: No. °  °Oxygen Delivery: room air ° °DISCHARGE LOCATION:  °group home  ° °If you experience worsening of your admission symptoms, develop shortness of breath, life threatening emergency, suicidal or homicidal thoughts you must seek medical attention immediately by calling 911 or calling your MD immediately  if symptoms less severe. ° °You Must read complete instructions/literature along with all the possible adverse reactions/side effects for all the Medicines you take and that have been prescribed to you. Take any new Medicines after you have completely understood and accpet all the possible adverse reactions/side effects.  ° °Please note ° °You were cared for by a hospitalist during your hospital stay. If you have any questions about your discharge medications or the care you received while you were in the hospital after you are discharged, you can call the unit and asked to speak with the hospitalist on call if the hospitalist that took care of you is not available. Once you are discharged, your primary care physician will handle any further medical issues. Please note that NO REFILLS for any discharge medications will be authorized once you are discharged, as it is imperative that you return to your primary care physician (or establish a relationship with a primary care physician if you do not have one) for your aftercare needs so that they can reassess your need for medications and monitor your lab values. ° ° °

## 2015-03-24 NOTE — Care Management Important Message (Signed)
Important Message  Patient Details  Name: Brandon Singh MRN: [id number] Date of Birth: 11-22-1955   Medicare Important Message Given:  Yes    Yareli Carthen A, RN 03/24/2015, 8:11 AM

## 2015-03-24 NOTE — Progress Notes (Signed)
Initial Nutrition Assessment     INTERVENTION:  Meals and snacks: monitor intake   NUTRITION DIAGNOSIS:    (none at this time) related to   as evidenced by  .    GOAL:   Patient will meet greater than or equal to 90% of their needs    MONITOR:    (Energy intake, Digestive system)  REASON FOR ASSESSMENT:   Malnutrition Screening Tool    ASSESSMENT:      Pt admitted with GI bleeding and syncopal episode  Past Medical History  Diagnosis Date  . Hypertension   . Schizophrenia ([place])   . Mental retardation   . Hypothyroidism   . Dementia   . Sleep apnea     Current Nutrition: ate 100% of breakfast, this am, tray observed.  Limited documentation regarding intake  Food/Nutrition-Related History: pt reports good appetite prior to admission (MR noted)   Scheduled Medications:  . antiseptic oral rinse  7 mL Mouth Rinse BID  . Chlorhexidine Gluconate Cloth  6 each Topical Q0600  . fluPHENAZine  10 mg Oral Daily  . fluPHENAZine  15 mg Oral QHS  . levothyroxine  112 mcg Oral QAC breakfast  . mupirocin ointment  1 application Nasal BID  . pantoprazole (PROTONIX) IV  40 mg Intravenous Q12H  . QUEtiapine  200 mg Oral BID  . QUEtiapine  400 mg Oral QHS  . sertraline  150 mg Oral Daily  . sodium chloride flush  3 mL Intravenous Q12H  . traZODone  200 mg Oral QHS    Continuous Medications:  . sodium chloride 50 mL/hr at 03/24/15 0550     Electrolyte/Renal Profile and Glucose Profile:   Recent Labs Lab 03/21/15 2126  NA 144  K 3.5  CL 111  CO2 28  BUN 35*  CREATININE 1.29*  CALCIUM 8.4*  GLUCOSE 136*   Protein Profile:  Recent Labs Lab 03/21/15 2126  ALBUMIN 2.9*    Gastrointestinal Profile: Last BM: 1/27   Nutrition-Focused Physical Exam Findings: deferred at this time   Weight Change: wt encounters reviewed, no wt loss    Diet Order:  Diet regular Room service appropriate?: Yes; Fluid consistency:: Thin  Skin:    reviewed   Height:   Ht Readings from Last 1 Encounters:  03/22/15 5\' 7"  (1.702 m)    Weight:   Wt Readings from Last 1 Encounters:  03/22/15 177 lb 14.6 oz (80.7 kg)    Ideal Body Weight:     BMI:  Body mass index is 27.86 kg/(m^2).  EDUCATION NEEDS:   No education needs identified at this time  LOW Care Level  Desiderio Dolata B. Zenia Resides, [place], [place] (pager) Weekend/On-Call pager [phone])

## 2015-03-24 NOTE — Clinical Social Work Note (Signed)
As a side note: DSS APS worker's name is Event organiser H. Shela Leff MSW,LCSW [phone]

## 2015-03-24 NOTE — Progress Notes (Signed)
Pt Alert to Self and Place only. VSS, Hgb stable. Pt voiding with no difficulties. Pt tolerating diet. No pain or nausea. Report was called to Pt's group home and someone came to pick him up. Packet was given to pts ride, as well as pts chart from the group home was returned. Pt ambulated downstairs with nurse.

## 2015-03-24 NOTE — Discharge Summary (Signed)
Brandon Singh    MR#:  [id number]  DATE OF BIRTH:  March 21, 1955  DATE OF ADMISSION:  03/21/2015 ADMITTING PHYSICIAN: Lytle Butte, MD  DATE OF DISCHARGE: No discharge date for patient encounter.  PRIMARY CARE PHYSICIAN: Elba Barman, MD    ADMISSION DIAGNOSIS:  Gastrointestinal hemorrhage associated with anorectal source [K62.5] Hypotension, unspecified hypotension type [I95.9]  DISCHARGE DIAGNOSIS:  Principal Problem:   Bright red blood per rectum Active Problems:   Syncope   SECONDARY DIAGNOSIS:   Past Medical History  Diagnosis Date  . Hypertension   . Schizophrenia ([place])   . Mental retardation   . Hypothyroidism   . Dementia   . Sleep apnea      ADMITTING HISTORY  Brandon Singh is a 60 y.o. male with a known history of hypothyroidism unspecified, mental retardation who is presenting from group home after syncopal episode. Apparently he had multiple episodes of maroon colored stools today followed by a syncopal episode subsequently sent to the Hospital further workup and evaluation he is initially hypotensive with systolic blood pressure in the 60s however fluid responsive. The patient is out provide meaningful information given mental status. He states only "I didn't feel myself and I would like a pancake"   HOSPITAL COURSE:   60 year old African-American gentleman history of mental retardation presented with multiple maroon colored stools with subsequent syncopal episode  1. Bright red blood per rectum/GI bleed S/P 1 unit PRBC Consulted GI and suggested OP w/u if Hb stable. Protonix IV BID. Will prescribe protonix daily PO. F/U with GI Dr. Rayann Heman for OP workup.  Acute blood loss anemia Stable Start Iron replacement PO  2. Hypothyroidism unspecified: Synthroid  3. Syncopal episodes due to hypovolemia of blood loss  4. Venous embolism prophylactic: SCDs No  Heparin/Lovenox  Stable for discharge to follow up with GI and PCP   CONSULTS OBTAINED:  Treatment Team:  Lytle Butte, MD Josefine Class, MD  DRUG ALLERGIES:  No Known Allergies  DISCHARGE MEDICATIONS:   Current Discharge Medication List    START taking these medications   Details  ferrous sulfate 325 (65 FE) MG tablet Take 1 tablet (325 mg total) by mouth 2 (two) times daily with a meal. Qty: 180 tablet, Refills: 0    pantoprazole (PROTONIX) 40 MG tablet Take 1 tablet (40 mg total) by mouth daily. Qty: 30 tablet, Refills: 0      CONTINUE these medications which have NOT CHANGED   Details  !! fluPHENAZine (PROLIXIN) 10 MG tablet Take 10 mg by mouth daily.    !! fluPHENAZine (PROLIXIN) 10 MG tablet Take 15 mg by mouth at bedtime.    levothyroxine (SYNTHROID, LEVOTHROID) 112 MCG tablet Take 112 mcg by mouth daily before breakfast.    !! QUEtiapine (SEROQUEL) 200 MG tablet Take 200 mg by mouth 2 (two) times daily.    !! QUEtiapine (SEROQUEL) 400 MG tablet Take 400 mg by mouth at bedtime.    sertraline (ZOLOFT) 100 MG tablet Take 150 mg by mouth daily.    traZODone (DESYREL) 100 MG tablet Take 200 mg by mouth at bedtime.     !! - Potential duplicate medications found. Please discuss with provider.       Today    VITAL SIGNS:  Blood pressure 108/65, pulse 87, temperature 98.6 F (37 C), temperature source Oral, resp. rate 18, height 5\' 7"  (1.702 m), weight 80.7 kg (177 lb 14.6  oz), SpO2 100 %.  I/O:   Intake/Output Summary (Last 24 hours) at 03/24/15 1223 Last data filed at 03/24/15 1057  Gross per 24 hour  Intake 965.67 ml  Output   2900 ml  Net -1934.33 ml    PHYSICAL EXAMINATION:  Physical Exam  GENERAL:  60 y.o.-year-old patient lying in the bed with no acute distress.  NEUROLOGIC: Moves all 4 extremities. PSYCHIATRIC: The patient is alert and awake. confused  DATA REVIEW:   CBC  Recent Labs Lab 03/22/15 0353  03/24/15 0524  WBC 9.6   --   --   HGB 9.0*  < > 8.1*  HCT 27.1*  --   --   PLT 128*  --   --   < > = values in this interval not displayed.  Chemistries   Recent Labs Lab 03/21/15 2126  NA 144  K 3.5  CL 111  CO2 28  GLUCOSE 136*  BUN 35*  CREATININE 1.29*  CALCIUM 8.4*  AST 31  ALT 32  ALKPHOS 58  BILITOT 0.5    Cardiac Enzymes  Recent Labs Lab 03/22/15 2209  TROPONINI <0.03    Microbiology Results  Results for orders placed or performed during the hospital encounter of 03/21/15  MRSA PCR Screening     Status: Abnormal   Collection Time: 03/22/15  1:39 AM  Result Value Ref Range Status   MRSA by PCR POSITIVE (A) NEGATIVE Final    Comment:        The GeneXpert MRSA Assay (FDA approved for NASAL specimens only), is one component of a comprehensive MRSA colonization surveillance program. It is not intended to diagnose MRSA infection nor to guide or monitor treatment for MRSA infections. CRITICAL RESULT CALLED TO, READ BACK BY AND VERIFIED WITH: BRITTONLEE RUSTCHESTER AT [id number] ON 03/22/15 RWW     RADIOLOGY:  No results found.    Follow up with PCP in 1 week.  Management plans discussed with the patient, family and they are in agreement.  CODE STATUS:     Code Status Orders        Start     Ordered   03/21/15 2252  Full code   Continuous     03/21/15 2252    Code Status History    Date Active Date Inactive Code Status Order ID Comments User Context   This patient has a current code status but no historical code status.      TOTAL TIME TAKING CARE OF THIS PATIENT ON DAY OF DISCHARGE: more than 30 minutes.    Hillary Bow R M.D on 03/24/2015 at 12:23 PM  Between 7am to 6pm - Pager - [phone]  After 6pm go to [email or web address] - password [email or web address] [facility]  Office  [phone]  CC: Primary care physician; Elba Barman, MD     Note: This dictation was prepared with Dragon dictation along with smaller phrase technology. Any  transcriptional errors that result from this process are unintentional.

## 2015-03-24 NOTE — Clinical Social Work Note (Signed)
Patient is to discharge today to return to his group home. Patient's brother does not answer. DSS APS arrived to [facility] this afternoon stating that they will be investigating patient and state that they are not able to reach his brother either. DSS has stated that there are some concerns with patient at his current group home but that for now, patient is allowed to return there.  Shela Leff MSW,LCSW [phone]

## 2015-04-10 ENCOUNTER — Encounter: Payer: Self-pay | Admitting: *Deleted

## 2015-04-11 ENCOUNTER — Encounter: Payer: Self-pay | Admitting: Anesthesiology

## 2015-04-11 ENCOUNTER — Ambulatory Visit: Payer: Medicare Other | Admitting: Anesthesiology

## 2015-04-11 ENCOUNTER — Encounter
Admission: RE | Disposition: A | Discharge status: Home / Self Care | Payer: Self-pay | Source: Ambulatory Visit | Attending: Gastroenterology

## 2015-04-11 ENCOUNTER — Ambulatory Visit
Admission: RE | Admit: 2015-04-11 | Discharge: 2015-04-11 | Disposition: A | Discharge status: Home / Self Care | Payer: Medicare Other | Source: Ambulatory Visit | Attending: Gastroenterology | Admitting: Gastroenterology

## 2015-04-11 DIAGNOSIS — Z79899 Other long term (current) drug therapy: Secondary | ICD-10-CM | POA: Insufficient documentation

## 2015-04-11 DIAGNOSIS — F209 Schizophrenia, unspecified: Secondary | ICD-10-CM | POA: Insufficient documentation

## 2015-04-11 DIAGNOSIS — D128 Benign neoplasm of rectum: Secondary | ICD-10-CM | POA: Insufficient documentation

## 2015-04-11 DIAGNOSIS — F0390 Unspecified dementia without behavioral disturbance: Secondary | ICD-10-CM | POA: Diagnosis not present

## 2015-04-11 DIAGNOSIS — I10 Essential (primary) hypertension: Secondary | ICD-10-CM | POA: Insufficient documentation

## 2015-04-11 DIAGNOSIS — D123 Benign neoplasm of transverse colon: Secondary | ICD-10-CM | POA: Diagnosis not present

## 2015-04-11 DIAGNOSIS — K921 Melena: Secondary | ICD-10-CM | POA: Diagnosis present

## 2015-04-11 DIAGNOSIS — F79 Unspecified intellectual disabilities: Secondary | ICD-10-CM | POA: Insufficient documentation

## 2015-04-11 DIAGNOSIS — D124 Benign neoplasm of descending colon: Secondary | ICD-10-CM | POA: Diagnosis not present

## 2015-04-11 DIAGNOSIS — F17210 Nicotine dependence, cigarettes, uncomplicated: Secondary | ICD-10-CM | POA: Diagnosis not present

## 2015-04-11 DIAGNOSIS — E039 Hypothyroidism, unspecified: Secondary | ICD-10-CM | POA: Insufficient documentation

## 2015-04-11 DIAGNOSIS — G4730 Sleep apnea, unspecified: Secondary | ICD-10-CM | POA: Insufficient documentation

## 2015-04-11 HISTORY — PX: COLONOSCOPY WITH PROPOFOL: SHX5780

## 2015-04-11 SURGERY — COLONOSCOPY WITH PROPOFOL
Anesthesia: General

## 2015-04-11 MED ORDER — PROPOFOL 500 MG/50ML IV EMUL
INTRAVENOUS | Status: DC | PRN
  Administered 2015-04-11: 140 ug/kg/min via INTRAVENOUS

## 2015-04-11 MED ORDER — PROPOFOL 10 MG/ML IV BOLUS
INTRAVENOUS | Status: DC | PRN
  Administered 2015-04-11: 50 mg via INTRAVENOUS

## 2015-04-11 MED ORDER — SODIUM CHLORIDE 0.9 % IV SOLN
INTRAVENOUS | Status: DC | PRN
  Administered 2015-04-11: 14:00:00
  Administered 2015-04-11: 14:00:00 via INTRAVENOUS

## 2015-04-11 NOTE — Discharge Instructions (Signed)

## 2015-04-11 NOTE — Op Note (Signed)
[facility]enter[facility] Patient Name: Brandon Singh Procedure Date: 04/11/2015 1:36 PM MRN: [id number] Account #: [id number] Date of Birth: [birth year] Admit Type: Outpatient Age: 60 Room: [facility] ENDO ROOM 2 Gender: Male Note Status: Finalized Procedure:            Colonoscopy Indications:          This is the patient's first colonoscopy, Hematochezia Patient Profile:      This is a 60 year old male. Providers:            Gerrit Heck. Rayann Heman, MD Medicines:            Propofol per Anesthesia Complications:        No immediate complications. Procedure:            Pre-Anesthesia Assessment:                       - Prior to the procedure, a History and Physical was                        performed, and patient medications, allergies and                        sensitivities were reviewed. The patient's tolerance of                        previous anesthesia was reviewed.                       After obtaining informed consent, the colonoscope was                        passed under direct vision. Throughout the procedure,                        the patient's blood pressure, pulse, and oxygen                        saturations were monitored continuously. The                        Colonoscope was introduced through the anus and                        advanced to the the cecum, identified by appendiceal                        orifice and ileocecal valve. The colonoscopy was                        somewhat difficult due to a tortuous colon. Successful                        completion of the procedure was aided by using manual                        pressure. The patient tolerated the procedure well. The                        quality of the bowel preparation was fair. Findings:      The perianal and digital rectal examinations  were normal.      A 4 mm polyp was found in the proximal transverse colon. The polyp was       sessile. The polyp was removed with a jumbo cold  forceps. Resection and       retrieval were complete.      A 3 mm polyp was found in the descending colon. The polyp was sessile.       The polyp was removed with a jumbo cold forceps. Resection and retrieval       were complete.      Two sessile polyps were found in the rectum. The polyps were 3 to 4 mm       in size. These polyps were removed with a jumbo cold forceps. Resection       and retrieval were complete.      The exam was otherwise without abnormality on direct and retroflexion       views. Impression:           - Preparation of the colon was fair.                       - One 4 mm polyp in the proximal transverse colon,                        removed with a jumbo cold forceps. Resected and                        retrieved.                       - One 3 mm polyp in the descending colon, removed with                        a jumbo cold forceps. Resected and retrieved.                       - Two 3 to 4 mm polyps in the rectum, removed with a                        jumbo cold forceps. Resected and retrieved.                       - The examination was otherwise normal on direct and                        retroflexion views.                       - No source of bleeding on this study. Recommendation:       - Observe patient in GI recovery unit.                       - Resume regular diet.                       - Continue present medications.                       - Await pathology results.                       - Repeat colonoscopy for  surveillance based on                        pathology results.                       - Return to referring physician.                       - The findings and recommendations were discussed with                        the patient.                       - The findings and recommendations were discussed with                        the patient's family.                       - Perform an upper GI endoscopy at appointment to be                         scheduled, since no source of bleedign on this study Procedure Code(s):    --- Professional ---                       [phone], Colonoscopy, flexible; with biopsy, single or                        multiple Diagnosis Code(s):    --- Professional ---                       D12.3, Benign neoplasm of transverse colon (hepatic                        flexure or splenic flexure)                       D12.4, Benign neoplasm of descending colon                       K62.1, Rectal polyp                       K92.1, Melena (includes Hematochezia) CPT copyright 2016 American Medical Association. All rights reserved. The codes documented in this report are preliminary and upon coder review may  be revised to meet current compliance requirements. Mellody Life, MD 04/11/2015 2:36:12 PM This report has been signed electronically. Number of Addenda: 0 Note Initiated On: 04/11/2015 1:36 PM Scope Withdrawal Time: 0 hours 16 minutes 21 seconds  Total Procedure Duration: 0 hours 26 minutes 59 seconds       [facility]

## 2015-04-11 NOTE — H&P (Signed)
  Primary Care Physician:  Elba Barman, MD  Pre-Procedure History & Physical: HPI:  Brandon Singh is a 60 y.o. male is here for an colonoscopy.   Past Medical History  Diagnosis Date  . Hypertension   . Schizophrenia ([place])   . Mental retardation   . Hypothyroidism   . Dementia   . Sleep apnea     History reviewed. No pertinent past surgical history.  Prior to Admission medications   Medication Sig Start Date End Date Taking? Authorizing Provider  FLUoxetine (PROZAC) 10 MG capsule Take 10 mg by mouth daily.   Yes Historical Provider, MD  ferrous sulfate 325 (65 FE) MG tablet Take 1 tablet (325 mg total) by mouth 2 (two) times daily with a meal. 03/24/15   Srikar Sudini, MD  fluPHENAZine (PROLIXIN) 10 MG tablet Take 10 mg by mouth daily.    Historical Provider, MD  fluPHENAZine (PROLIXIN) 10 MG tablet Take 15 mg by mouth at bedtime.    Historical Provider, MD  levothyroxine (SYNTHROID, LEVOTHROID) 112 MCG tablet Take 112 mcg by mouth daily before breakfast.    Historical Provider, MD  pantoprazole (PROTONIX) 40 MG tablet Take 1 tablet (40 mg total) by mouth daily. 03/24/15   Srikar Sudini, MD  QUEtiapine (SEROQUEL) 200 MG tablet Take 200 mg by mouth 2 (two) times daily.    Historical Provider, MD  QUEtiapine (SEROQUEL) 400 MG tablet Take 400 mg by mouth at bedtime.    Historical Provider, MD  sertraline (ZOLOFT) 100 MG tablet Take 150 mg by mouth daily.    Historical Provider, MD  traZODone (DESYREL) 100 MG tablet Take 200 mg by mouth at bedtime.    Historical Provider, MD    Allergies as of 04/03/2015  . (No Known Allergies)    Family History  Problem Relation Age of Onset  . Hypertension Other     Social History   Social History  . Marital Status: Single    Spouse Name: N/A  . Number of Children: N/A  . Years of Education: N/A   Occupational History  . Not on file.   Social History Main Topics  . Smoking status: Current Every Day Smoker -- 0.50 packs/day   Types: Cigarettes  . Smokeless tobacco: Not on file  . Alcohol Use: No  . Drug Use: No  . Sexual Activity: Not on file   Other Topics Concern  . Not on file   Social History Narrative     Physical Exam: BP 152/91 mmHg  Pulse 63  Temp(Src) 95.6 F (35.3 C) (Tympanic)  Resp 18  SpO2 100% General:   Alert,  pleasant and cooperative in NAD Head:  Normocephalic and atraumatic. Neck:  Supple; no masses or thyromegaly. Lungs:  Clear throughout to auscultation.    Heart:  Regular rate and rhythm. Abdomen:  Soft, nontender and nondistended. Normal bowel sounds, without guarding, and without rebound.   Neurologic:  Alert and  oriented x4;  grossly normal neurologically.  Impression/Plan: Brandon Singh is here for an colonoscopy to be performed for rectal bleeding  Risks, benefits, limitations, and alternatives regarding  colonoscopy have been reviewed with the patient and guardian.  Questions have been answered.  All parties agreeable.   Josefine Class, MD  04/11/2015, 1:57 PM

## 2015-04-11 NOTE — Transfer of Care (Signed)
Immediate Anesthesia Transfer of Care Note  Patient: Brandon Singh  Procedure(s) Performed: Procedure(s): COLONOSCOPY WITH PROPOFOL (N/A)  Patient Location: PACU  Anesthesia Type:General  Level of Consciousness: awake  Airway & Oxygen Therapy: Patient Spontanous Breathing  Post-op Assessment: Report given to RN  Post vital signs: Reviewed  Last Vitals:  Filed Vitals:   04/11/15 1303  BP: 152/91  Pulse: 63  Temp: 35.3 C  Resp: 18    Complications: No apparent anesthesia complications

## 2015-04-11 NOTE — Anesthesia Preprocedure Evaluation (Signed)
Anesthesia Evaluation  Patient identified by MRN, date of birth, ID band Patient awake    Reviewed: Allergy & Precautions, H&P , NPO status , Patient's Chart, lab work & pertinent test results, reviewed documented beta blocker date and time   History of Anesthesia Complications Negative for: history of anesthetic complications  Airway Mallampati: III  TM Distance: >3 FB Neck ROM: full    Dental no notable dental hx. (+) Edentulous Upper, Edentulous Lower   Pulmonary neg shortness of breath, sleep apnea , neg COPD, neg recent URI, Current Smoker,    Pulmonary exam normal breath sounds clear to auscultation       Cardiovascular Exercise Tolerance: Good hypertension, (-) angina(-) CAD, (-) Past MI, (-) Cardiac Stents and (-) CABG negative cardio ROS Normal cardiovascular exam(-) dysrhythmias (-) Valvular Problems/Murmurs Rhythm:regular Rate:Normal     Neuro/Psych PSYCHIATRIC DISORDERS (Dementia, schizophrenia, and mental retardation.) negative neurological ROS     GI/Hepatic negative GI ROS, Neg liver ROS,   Endo/Other  neg diabetesHypothyroidism   Renal/GU negative Renal ROS  negative genitourinary   Musculoskeletal   Abdominal   Peds  Hematology negative hematology ROS (+)   Anesthesia Other Findings Past Medical History:   Hypertension                                                 Schizophrenia ([place])                                          Mental retardation                                           Hypothyroidism                                               Dementia                                                     Sleep apnea                                                  Reproductive/Obstetrics negative OB ROS                             Anesthesia Physical Anesthesia Plan  ASA: III  Anesthesia Plan: General   Post-op Pain Management:    Induction:   Airway  Management Planned:   Additional Equipment:   Intra-op Plan:   Post-operative Plan:   Informed Consent: I have reviewed the patients History and Physical, chart, labs and discussed the procedure including the risks, benefits and alternatives for the proposed anesthesia with the patient or authorized representative who has  indicated his/her understanding and acceptance.   Dental Advisory Given  Plan Discussed with: Anesthesiologist, CRNA and Surgeon  Anesthesia Plan Comments:         Anesthesia Quick Evaluation

## 2015-04-14 NOTE — Anesthesia Postprocedure Evaluation (Signed)
Anesthesia Post Note  Patient: Brandon Singh  Procedure(s) Performed: Procedure(s) (LRB): COLONOSCOPY WITH PROPOFOL (N/A)  Patient location during evaluation: Endoscopy Anesthesia Type: General Level of consciousness: awake and alert Pain management: pain level controlled Vital Signs Assessment: post-procedure vital signs reviewed and stable Respiratory status: spontaneous breathing, nonlabored ventilation, respiratory function stable and patient connected to nasal cannula oxygen Cardiovascular status: blood pressure returned to baseline and stable Postop Assessment: no signs of nausea or vomiting Anesthetic complications: no    Last Vitals:  Filed Vitals:   04/11/15 1457 04/11/15 1507  BP: 135/93 141/90  Pulse:  89  Temp:    Resp: 13 24    Last Pain:  Filed Vitals:   04/12/15 1825  PainSc: 0-No pain                 Martha Clan

## 2015-04-15 ENCOUNTER — Encounter: Payer: Self-pay | Admitting: Gastroenterology

## 2015-04-15 LAB — SURGICAL PATHOLOGY

## 2015-05-02 ENCOUNTER — Encounter: Payer: Self-pay | Admitting: *Deleted

## 2015-05-05 ENCOUNTER — Ambulatory Visit: Payer: Medicare Other | Admitting: Anesthesiology

## 2015-05-05 ENCOUNTER — Ambulatory Visit
Admission: RE | Admit: 2015-05-05 | Discharge: 2015-05-05 | Disposition: A | Discharge status: Home / Self Care | Payer: Medicare Other | Source: Ambulatory Visit | Attending: Gastroenterology | Admitting: Gastroenterology

## 2015-05-05 ENCOUNTER — Encounter
Admission: RE | Disposition: A | Discharge status: Home / Self Care | Payer: Self-pay | Source: Ambulatory Visit | Attending: Gastroenterology

## 2015-05-05 ENCOUNTER — Encounter: Payer: Self-pay | Admitting: *Deleted

## 2015-05-05 DIAGNOSIS — R55 Syncope and collapse: Secondary | ICD-10-CM | POA: Insufficient documentation

## 2015-05-05 DIAGNOSIS — I10 Essential (primary) hypertension: Secondary | ICD-10-CM | POA: Insufficient documentation

## 2015-05-05 DIAGNOSIS — F0390 Unspecified dementia without behavioral disturbance: Secondary | ICD-10-CM | POA: Insufficient documentation

## 2015-05-05 DIAGNOSIS — Z79899 Other long term (current) drug therapy: Secondary | ICD-10-CM | POA: Insufficient documentation

## 2015-05-05 DIAGNOSIS — F209 Schizophrenia, unspecified: Secondary | ICD-10-CM | POA: Diagnosis not present

## 2015-05-05 DIAGNOSIS — F79 Unspecified intellectual disabilities: Secondary | ICD-10-CM | POA: Diagnosis not present

## 2015-05-05 DIAGNOSIS — E039 Hypothyroidism, unspecified: Secondary | ICD-10-CM | POA: Diagnosis not present

## 2015-05-05 DIAGNOSIS — J449 Chronic obstructive pulmonary disease, unspecified: Secondary | ICD-10-CM | POA: Insufficient documentation

## 2015-05-05 DIAGNOSIS — K269 Duodenal ulcer, unspecified as acute or chronic, without hemorrhage or perforation: Secondary | ICD-10-CM | POA: Diagnosis not present

## 2015-05-05 DIAGNOSIS — B9681 Helicobacter pylori [H. pylori] as the cause of diseases classified elsewhere: Secondary | ICD-10-CM | POA: Diagnosis not present

## 2015-05-05 DIAGNOSIS — K2950 Unspecified chronic gastritis without bleeding: Secondary | ICD-10-CM | POA: Diagnosis not present

## 2015-05-05 DIAGNOSIS — K921 Melena: Secondary | ICD-10-CM | POA: Diagnosis present

## 2015-05-05 DIAGNOSIS — G4730 Sleep apnea, unspecified: Secondary | ICD-10-CM | POA: Diagnosis not present

## 2015-05-05 DIAGNOSIS — F17210 Nicotine dependence, cigarettes, uncomplicated: Secondary | ICD-10-CM | POA: Insufficient documentation

## 2015-05-05 HISTORY — PX: ESOPHAGOGASTRODUODENOSCOPY (EGD) WITH PROPOFOL: SHX5813

## 2015-05-05 SURGERY — ESOPHAGOGASTRODUODENOSCOPY (EGD) WITH PROPOFOL
Anesthesia: General

## 2015-05-05 MED ORDER — PROPOFOL 10 MG/ML IV BOLUS
INTRAVENOUS | Status: DC | PRN
  Administered 2015-05-05: 100 mg via INTRAVENOUS

## 2015-05-05 MED ORDER — MIDAZOLAM HCL 5 MG/5ML IJ SOLN
INTRAMUSCULAR | Status: DC | PRN
  Administered 2015-05-05: 1 mg via INTRAVENOUS

## 2015-05-05 MED ORDER — SODIUM CHLORIDE 0.9 % IV SOLN
INTRAVENOUS | Status: DC
  Administered 2015-05-05: 09:00:00 via INTRAVENOUS

## 2015-05-05 MED ORDER — LIDOCAINE HCL (CARDIAC) 20 MG/ML IV SOLN
INTRAVENOUS | Status: DC | PRN
  Administered 2015-05-05: 100 mg via INTRAVENOUS

## 2015-05-05 MED ORDER — FENTANYL CITRATE (PF) 100 MCG/2ML IJ SOLN
INTRAMUSCULAR | Status: DC | PRN
  Administered 2015-05-05: 50 ug via INTRAVENOUS

## 2015-05-05 MED ORDER — GLYCOPYRROLATE 0.2 MG/ML IJ SOLN
INTRAMUSCULAR | Status: DC | PRN
  Administered 2015-05-05: 0.2 mg via INTRAVENOUS

## 2015-05-05 NOTE — Anesthesia Postprocedure Evaluation (Signed)
Anesthesia Post Note  Patient: Brandon Singh  Procedure(s) Performed: Procedure(s) (LRB): ESOPHAGOGASTRODUODENOSCOPY (EGD) WITH PROPOFOL (N/A)  Patient location during evaluation: PACU Anesthesia Type: General Level of consciousness: awake Pain management: satisfactory to patient Vital Signs Assessment: post-procedure vital signs reviewed and stable Respiratory status: nonlabored ventilation Cardiovascular status: stable Anesthetic complications: no    Last Vitals:  Filed Vitals:   05/05/15 1004 05/05/15 1005  BP: 102/73 102/73  Pulse: 78 78  Temp: 36.1 C 36 C  Resp: 16 15    Last Pain: There were no vitals filed for this visit.               VAN STAVEREN,Anderson Coppock

## 2015-05-05 NOTE — Op Note (Signed)
[facility] Gastroenterology Patient Name: Brandon Singh Procedure Date: 05/05/2015 9:37 AM MRN: [id number] Account #: [id number] Date of Birth: 07-Dec-1955 Admit Type: Outpatient Age: 60 Room: [facility] ENDO ROOM 1 Gender: Male Note Status: Finalized Procedure:            Upper GI endoscopy Indications:          Hematochezia, with syncope( no source found on                        colonoscopy) Patient Profile:      This is a 60 year old male. Providers:            Gerrit Heck. Rayann Heman, MD Referring MD:         Marrianne Mood, MD (Referring MD) Medicines:            Propofol per Anesthesia Complications:        No immediate complications. Procedure:            Pre-Anesthesia Assessment:                       - Prior to the procedure, a History and Physical was                        performed, and patient medications, allergies and                        sensitivities were reviewed. The patient's tolerance of                        previous anesthesia was reviewed.                       After obtaining informed consent, the endoscope was                        passed under direct vision. Throughout the procedure,                        the patient's blood pressure, pulse, and oxygen                        saturations were monitored continuously. The Endoscope                        was introduced through the mouth, and advanced to the                        second part of duodenum. The upper GI endoscopy was                        accomplished without difficulty. The patient tolerated                        the procedure well. Findings:      The esophagus was normal.      The stomach was normal.      One non-bleeding superficial duodenal ulcer with no stigmata of bleeding       was found in the duodenal bulb. The lesion was 4 mm in largest       dimension. The ulcer appeared to be nearly  healed.      Multiple diffuse erosions without bleeding were found in the duodenal   bulb.      Biopsies were taken with a cold forceps in the gastric body and in the       gastric antrum for Helicobacter pylori testing. Impression:           - Normal esophagus.                       - Normal stomach.                       - One non-bleeding duodenal ulcer with no stigmata of                        bleeding. The ulcer appeared to be nearly healed.                       - Duodenal erosions without bleeding.                       - Biopsies were taken with a cold forceps for                        Helicobacter pylori testing.                       - The hematochezia with syncope was likely rapid upper                        bleed from duodenal ulcer. Recommendation:       - Observe patient in GI recovery unit.                       - Resume regular diet.                       - Continue present medications.                       - Avoid nsaids                       - Take prilosec 20 mg daily                       - Treat h pylori if positive.                       - Await pathology results.                       - No aspirin, ibuprofen, naproxen, or other                        non-steroidal anti-inflammatory drugs.                       - The findings and recommendations were discussed with                        the patient. Procedure Code(s):    --- Professional ---  [id number], Esophagogastroduodenoscopy, flexible, transoral;                        with biopsy, single or multiple Diagnosis Code(s):    --- Professional ---                       K26.9, Duodenal ulcer, unspecified as acute or chronic,                        without hemorrhage or perforation                       K92.1, Melena (includes Hematochezia) CPT copyright 2016 American Medical Association. All rights reserved. The codes documented in this report are preliminary and upon coder review may  be revised to meet current compliance requirements. Mellody Life, MD 05/05/2015 9:59:11  AM This report has been signed electronically. Number of Addenda: 0 Note Initiated On: 05/05/2015 9:37 AM      [facility]

## 2015-05-05 NOTE — Transfer of Care (Signed)
Immediate Anesthesia Transfer of Care Note  Patient: Brandon Singh  Procedure(s) Performed: Procedure(s): ESOPHAGOGASTRODUODENOSCOPY (EGD) WITH PROPOFOL (N/A)  Patient Location: PACU  Anesthesia Type:General  Level of Consciousness: sedated  Airway & Oxygen Therapy: Patient Spontanous Breathing and Patient connected to nasal cannula oxygen  Post-op Assessment: Report given to RN and Post -op Vital signs reviewed and stable  Post vital signs: Reviewed and stable  Last Vitals:  Filed Vitals:   05/05/15 1004 05/05/15 1005  BP: 102/73 102/73  Pulse: 78 78  Temp: 36.1 C 36 C  Resp: 16 15    Complications: No apparent anesthesia complications

## 2015-05-05 NOTE — Discharge Instructions (Signed)

## 2015-05-05 NOTE — Anesthesia Preprocedure Evaluation (Addendum)
Anesthesia Evaluation  Patient identified by MRN, date of birth, ID band Patient confused    Reviewed: Allergy & Precautions, NPO status , Patient's Chart, lab work & pertinent test results  Airway Mallampati: II       Dental  (+) Edentulous Upper, Edentulous Lower   Pulmonary sleep apnea , COPD, Current Smoker,     + decreased breath sounds      Cardiovascular Exercise Tolerance: Poor hypertension, Pt. on medications  Rhythm:Regular     Neuro/Psych Schizophrenia    GI/Hepatic negative GI ROS, Neg liver ROS,   Endo/Other  Hypothyroidism   Renal/GU negative Renal ROS     Musculoskeletal negative musculoskeletal ROS (+)   Abdominal Normal abdominal exam  (+)   Peds  Hematology   Anesthesia Other Findings   Reproductive/Obstetrics                            Anesthesia Physical Anesthesia Plan  ASA: III  Anesthesia Plan: General   Post-op Pain Management:    Induction: Intravenous  Airway Management Planned: Natural Airway and Nasal Cannula  Additional Equipment:   Intra-op Plan:   Post-operative Plan:   Informed Consent: I have reviewed the patients History and Physical, chart, labs and discussed the procedure including the risks, benefits and alternatives for the proposed anesthesia with the patient or authorized representative who has indicated his/her understanding and acceptance.     Plan Discussed with: CRNA  Anesthesia Plan Comments:         Anesthesia Quick Evaluation

## 2015-05-05 NOTE — H&P (Signed)
Primary Care Physician:  Elba Barman, MD  Pre-Procedure History & Physical: HPI:  Brandon Singh is a 60 y.o. male is here for an endoscopy.   Past Medical History  Diagnosis Date  . Hypertension   . Schizophrenia ([place])   . Mental retardation   . Hypothyroidism   . Dementia   . Sleep apnea     Past Surgical History  Procedure Laterality Date  . Colonoscopy with propofol N/A 04/11/2015    Procedure: COLONOSCOPY WITH PROPOFOL;  Surgeon: Josefine Class, MD;  Location: [facility] ENDOSCOPY;  Service: Endoscopy;  Laterality: N/A;    Prior to Admission medications   Medication Sig Start Date End Date Taking? Authorizing Provider  ferrous sulfate 325 (65 FE) MG tablet Take 1 tablet (325 mg total) by mouth 2 (two) times daily with a meal. 03/24/15   Srikar Sudini, MD  FLUoxetine (PROZAC) 10 MG capsule Take 10 mg by mouth daily.    Historical Provider, MD  fluPHENAZine (PROLIXIN) 10 MG tablet Take 10 mg by mouth daily.    Historical Provider, MD  fluPHENAZine (PROLIXIN) 10 MG tablet Take 15 mg by mouth at bedtime.    Historical Provider, MD  levothyroxine (SYNTHROID, LEVOTHROID) 112 MCG tablet Take 112 mcg by mouth daily before breakfast.    Historical Provider, MD  pantoprazole (PROTONIX) 40 MG tablet Take 1 tablet (40 mg total) by mouth daily. 03/24/15   Srikar Sudini, MD  QUEtiapine (SEROQUEL) 200 MG tablet Take 200 mg by mouth 2 (two) times daily.    Historical Provider, MD  QUEtiapine (SEROQUEL) 400 MG tablet Take 400 mg by mouth at bedtime.    Historical Provider, MD  sertraline (ZOLOFT) 100 MG tablet Take 150 mg by mouth daily.    Historical Provider, MD  traZODone (DESYREL) 100 MG tablet Take 200 mg by mouth at bedtime.    Historical Provider, MD    Allergies as of 04/24/2015  . (No Known Allergies)    Family History  Problem Relation Age of Onset  . Hypertension Other     Social History   Social History  . Marital Status: Single    Spouse Name: N/A  . Number of  Children: N/A  . Years of Education: N/A   Occupational History  . Not on file.   Social History Main Topics  . Smoking status: Current Every Day Smoker -- 0.50 packs/day    Types: Cigarettes  . Smokeless tobacco: Not on file  . Alcohol Use: No  . Drug Use: No  . Sexual Activity: Not on file   Other Topics Concern  . Not on file   Social History Narrative     Physical Exam: BP 142/82 mmHg  Pulse 82  Temp(Src) 97.8 F (36.6 C) (Tympanic)  Resp 16  Ht 5\' 7"  (1.702 m)  Wt 80.287 kg (177 lb)  BMI 27.72 kg/m2  SpO2 100% General:   Alert,  pleasant and cooperative in NAD Head:  Normocephalic and atraumatic. Neck:  Supple; no masses or thyromegaly. Lungs:  Clear throughout to auscultation.    Heart:  Regular rate and rhythm. Abdomen:  Soft, nontender and nondistended. Normal bowel sounds, without guarding, and without rebound.   Neurologic:  Alert and  oriented x4;  grossly normal neurologically.  Impression/Plan: Brandon Singh is here for an endoscopy to be performed for hematochezia with syncope, no source on colonoscopy  Risks, benefits, limitations, and alternatives regarding  endoscopy have been reviewed with the patient.  Questions have been  answered.  All parties agreeable.   Josefine Class, MD  05/05/2015, 9:43 AM

## 2015-05-06 ENCOUNTER — Encounter: Payer: Self-pay | Admitting: Gastroenterology

## 2015-05-06 LAB — SURGICAL PATHOLOGY

## 2015-05-06 NOTE — Progress Notes (Signed)
Care provider stated patient doing well.  Encouraged care provider to call physician office concerning perscription that had not been called in.  Dr. Not here to scope today or would have let him know then.

## 2015-08-28 ENCOUNTER — Observation Stay
Admission: EM | Admit: 2015-08-28 | Discharge: 2015-08-29 | Payer: Medicare Other | Attending: Internal Medicine | Admitting: Internal Medicine

## 2015-08-28 ENCOUNTER — Emergency Department: Payer: Medicare Other

## 2015-08-28 ENCOUNTER — Encounter: Payer: Self-pay | Admitting: Emergency Medicine

## 2015-08-28 DIAGNOSIS — R55 Syncope and collapse: Principal | ICD-10-CM | POA: Diagnosis present

## 2015-08-28 DIAGNOSIS — K269 Duodenal ulcer, unspecified as acute or chronic, without hemorrhage or perforation: Secondary | ICD-10-CM | POA: Insufficient documentation

## 2015-08-28 DIAGNOSIS — K922 Gastrointestinal hemorrhage, unspecified: Secondary | ICD-10-CM | POA: Diagnosis present

## 2015-08-28 DIAGNOSIS — E039 Hypothyroidism, unspecified: Secondary | ICD-10-CM | POA: Diagnosis not present

## 2015-08-28 DIAGNOSIS — G4730 Sleep apnea, unspecified: Secondary | ICD-10-CM | POA: Diagnosis not present

## 2015-08-28 DIAGNOSIS — Z9889 Other specified postprocedural states: Secondary | ICD-10-CM | POA: Insufficient documentation

## 2015-08-28 DIAGNOSIS — F209 Schizophrenia, unspecified: Secondary | ICD-10-CM | POA: Insufficient documentation

## 2015-08-28 DIAGNOSIS — N170 Acute kidney failure with tubular necrosis: Secondary | ICD-10-CM | POA: Diagnosis not present

## 2015-08-28 DIAGNOSIS — Z79899 Other long term (current) drug therapy: Secondary | ICD-10-CM | POA: Insufficient documentation

## 2015-08-28 DIAGNOSIS — E860 Dehydration: Secondary | ICD-10-CM | POA: Insufficient documentation

## 2015-08-28 DIAGNOSIS — Z8249 Family history of ischemic heart disease and other diseases of the circulatory system: Secondary | ICD-10-CM | POA: Diagnosis not present

## 2015-08-28 DIAGNOSIS — F17210 Nicotine dependence, cigarettes, uncomplicated: Secondary | ICD-10-CM | POA: Diagnosis not present

## 2015-08-28 DIAGNOSIS — I10 Essential (primary) hypertension: Secondary | ICD-10-CM | POA: Diagnosis not present

## 2015-08-28 DIAGNOSIS — Z8711 Personal history of peptic ulcer disease: Secondary | ICD-10-CM | POA: Diagnosis not present

## 2015-08-28 DIAGNOSIS — F0390 Unspecified dementia without behavioral disturbance: Secondary | ICD-10-CM | POA: Insufficient documentation

## 2015-08-28 DIAGNOSIS — F79 Unspecified intellectual disabilities: Secondary | ICD-10-CM | POA: Insufficient documentation

## 2015-08-28 LAB — URINALYSIS COMPLETE WITH MICROSCOPIC (ARMC ONLY)
BACTERIA UA: NONE SEEN
Bilirubin Urine: NEGATIVE
Glucose, UA: NEGATIVE mg/dL
HGB URINE DIPSTICK: NEGATIVE
Leukocytes, UA: NEGATIVE
Nitrite: NEGATIVE
PH: 6 (ref 5.0–8.0)
PROTEIN: NEGATIVE mg/dL
SPECIFIC GRAVITY, URINE: 1.019 (ref 1.005–1.030)

## 2015-08-28 LAB — BASIC METABOLIC PANEL
ANION GAP: 6 (ref 5–15)
BUN: 20 mg/dL (ref 6–20)
CHLORIDE: 104 mmol/L (ref 101–111)
CO2: 28 mmol/L (ref 22–32)
Calcium: 8.9 mg/dL (ref 8.9–10.3)
Creatinine, Ser: 1.47 mg/dL — ABNORMAL HIGH (ref 0.61–1.24)
GFR calc Af Amer: 58 mL/min — ABNORMAL LOW (ref >60)
GFR calc non Af Amer: 50 mL/min — ABNORMAL LOW (ref >60)
GLUCOSE: 108 mg/dL — AB (ref 65–99)
POTASSIUM: 4.3 mmol/L (ref 3.5–5.1)
Sodium: 138 mmol/L (ref 135–145)

## 2015-08-28 LAB — CBC WITH DIFFERENTIAL/PLATELET
Basophils Absolute: 0 10*3/uL (ref 0–0.1)
Basophils Relative: 0 %
Eosinophils Absolute: 0 10*3/uL (ref 0–0.7)
Eosinophils Relative: 0 %
HEMATOCRIT: 34.4 % — AB (ref 40.0–52.0)
HEMOGLOBIN: 11.9 g/dL — AB (ref 13.0–18.0)
LYMPHS ABS: 0.6 10*3/uL — AB (ref 1.0–3.6)
LYMPHS PCT: 4 %
MCH: 29.3 pg (ref 26.0–34.0)
MCHC: 34.5 g/dL (ref 32.0–36.0)
MCV: 84.7 fL (ref 80.0–100.0)
MONO ABS: 1 10*3/uL (ref 0.2–1.0)
MONOS PCT: 7 %
NEUTROS ABS: 13.1 10*3/uL — AB (ref 1.4–6.5)
NEUTROS PCT: 89 %
Platelets: 154 10*3/uL (ref 150–440)
RBC: 4.06 MIL/uL — ABNORMAL LOW (ref 4.40–5.90)
RDW: 13.5 % (ref 11.5–14.5)
WBC: 14.7 10*3/uL — ABNORMAL HIGH (ref 3.8–10.6)

## 2015-08-28 LAB — PROTIME-INR
INR: 1.27
Prothrombin Time: 16 seconds — ABNORMAL HIGH (ref 11.4–15.0)

## 2015-08-28 LAB — GLUCOSE, CAPILLARY: Glucose-Capillary: 72 mg/dL (ref 65–99)

## 2015-08-28 LAB — APTT: aPTT: 31 seconds (ref 24–36)

## 2015-08-28 LAB — TYPE AND SCREEN
ABO/RH(D): B POS
ANTIBODY SCREEN: NEGATIVE

## 2015-08-28 LAB — MRSA PCR SCREENING: MRSA BY PCR: POSITIVE — AB

## 2015-08-28 LAB — TROPONIN I

## 2015-08-28 MED ORDER — SERTRALINE HCL 50 MG PO TABS
150.0000 mg | ORAL_TABLET | Freq: Every day | ORAL | Status: DC
  Administered 2015-08-28 – 2015-08-29 (×2): 150 mg via ORAL
  Filled 2015-08-28 (×2): qty 1

## 2015-08-28 MED ORDER — SODIUM CHLORIDE 0.9 % IV SOLN
40.0000 mg | Freq: Once | INTRAVENOUS | Status: AC
  Administered 2015-08-28: 40 mg via INTRAVENOUS
  Filled 2015-08-28: qty 4

## 2015-08-28 MED ORDER — FERROUS SULFATE 325 (65 FE) MG PO TABS
325.0000 mg | ORAL_TABLET | Freq: Two times a day (BID) | ORAL | Status: DC
  Administered 2015-08-28 – 2015-08-29 (×2): 325 mg via ORAL
  Filled 2015-08-28 (×2): qty 1

## 2015-08-28 MED ORDER — SODIUM CHLORIDE 0.9 % IV BOLUS (SEPSIS)
1000.0000 mL | Freq: Once | INTRAVENOUS | Status: AC
  Administered 2015-08-28: 1000 mL via INTRAVENOUS

## 2015-08-28 MED ORDER — CHLORHEXIDINE GLUCONATE CLOTH 2 % EX PADS
6.0000 | MEDICATED_PAD | Freq: Every day | CUTANEOUS | Status: DC
  Administered 2015-08-29: 6 via TOPICAL

## 2015-08-28 MED ORDER — TRAZODONE HCL 100 MG PO TABS
200.0000 mg | ORAL_TABLET | Freq: Every day | ORAL | Status: DC
  Administered 2015-08-28: 200 mg via ORAL
  Filled 2015-08-28: qty 2

## 2015-08-28 MED ORDER — ACETAMINOPHEN 650 MG RE SUPP: 650.0000 mg | Freq: Four times a day (QID) | RECTAL | Status: DC | PRN

## 2015-08-28 MED ORDER — QUETIAPINE FUMARATE 200 MG PO TABS: 400.0000 mg | ORAL_TABLET | Freq: Every day | ORAL | Status: DC

## 2015-08-28 MED ORDER — MUPIROCIN 2 % EX OINT
1.0000 "application " | TOPICAL_OINTMENT | Freq: Two times a day (BID) | CUTANEOUS | Status: DC
  Administered 2015-08-28: 1 via NASAL
  Filled 2015-08-28: qty 22

## 2015-08-28 MED ORDER — FLUPHENAZINE HCL 10 MG PO TABS: 10.0000 mg | ORAL_TABLET | Freq: Every day | ORAL | Status: DC

## 2015-08-28 MED ORDER — FLUOXETINE HCL 10 MG PO CAPS
10.0000 mg | ORAL_CAPSULE | Freq: Every day | ORAL | Status: DC
  Administered 2015-08-29: 10 mg via ORAL
  Filled 2015-08-28 (×2): qty 1

## 2015-08-28 MED ORDER — SODIUM CHLORIDE 0.9 % IV SOLN
INTRAVENOUS | Status: DC
  Administered 2015-08-28 – 2015-08-29 (×2): via INTRAVENOUS

## 2015-08-28 MED ORDER — QUETIAPINE FUMARATE 200 MG PO TABS
200.0000 mg | ORAL_TABLET | Freq: Two times a day (BID) | ORAL | Status: DC
  Administered 2015-08-28 – 2015-08-29 (×2): 200 mg via ORAL
  Filled 2015-08-28 (×3): qty 1

## 2015-08-28 MED ORDER — ONDANSETRON HCL 4 MG/2ML IJ SOLN: 4.0000 mg | Freq: Four times a day (QID) | INTRAMUSCULAR | Status: DC | PRN

## 2015-08-28 MED ORDER — FAMOTIDINE IN NACL 20-0.9 MG/50ML-% IV SOLN
20.0000 mg | Freq: Two times a day (BID) | INTRAVENOUS | Status: DC
  Administered 2015-08-28: 20 mg via INTRAVENOUS
  Filled 2015-08-28 (×4): qty 50

## 2015-08-28 MED ORDER — BISACODYL 5 MG PO TBEC: 5.0000 mg | DELAYED_RELEASE_TABLET | Freq: Every day | ORAL | Status: DC | PRN

## 2015-08-28 MED ORDER — FLUPHENAZINE HCL 5 MG PO TABS
15.0000 mg | ORAL_TABLET | Freq: Every day | ORAL | Status: DC
Start: 2015-08-28 — End: 2015-08-29
  Administered 2015-08-28: 15 mg via ORAL
  Filled 2015-08-28: qty 3

## 2015-08-28 MED ORDER — ONDANSETRON HCL 4 MG PO TABS: 4.0000 mg | ORAL_TABLET | Freq: Four times a day (QID) | ORAL | Status: DC | PRN

## 2015-08-28 MED ORDER — ACETAMINOPHEN 325 MG PO TABS: 650.0000 mg | ORAL_TABLET | Freq: Four times a day (QID) | ORAL | Status: DC | PRN

## 2015-08-28 MED ORDER — LEVOTHYROXINE SODIUM 112 MCG PO TABS
112.0000 ug | ORAL_TABLET | Freq: Every day | ORAL | Status: DC
  Administered 2015-08-29: 112 ug via ORAL
  Filled 2015-08-28: qty 1

## 2015-08-28 NOTE — ED Provider Notes (Signed)
[facility] Emergency Department Provider Note  ____________________________________________  Time seen: Approximately 9:55 AM  I have reviewed the triage vital signs and the nursing notes.   HISTORY  Chief Complaint Altered Mental Status   HPI Brandon Singh is a 60 y.o. male the history of PUD with prior UGIB, hypertension, schizophrenia, mental retardation, hypothyroidism, dementia who presents for evaluation of syncope. Patient was reported missing from his group home today. He reports that he left because they wouldn't allow him to smoke. He went to [employer] and reports that he felt chest pain, dizziness, palpitations, shortness of breath and had a syncopal episode. When EMS arrived the patient was found face down in the supermarket and unresponsive. Patient's mental status improved and patient arrives alert and oriented 3. Patient had a glucose of 77 and normal vital signs. At this time he reports that he feels better and denies chest pain, dizziness, shortness of breath. Patient reports a few days of dark colored stools. He denies abdominal pain, nausea, vomiting. He denies NSAIDs use. Patient's last endoscopy on March 2017 showing a duodenal ulcer. Colonoscopy on February 2017 was unremarkable other than polyps.  Past Medical History  Diagnosis Date  . Hypertension   . Schizophrenia ([place])   . Mental retardation   . Hypothyroidism   . Dementia   . Sleep apnea     Patient Active Problem List   Diagnosis Date Noted  . Bright red blood per rectum 03/21/2015  . Syncope 03/21/2015    Past Surgical History  Procedure Laterality Date  . Colonoscopy with propofol N/A 04/11/2015    Procedure: COLONOSCOPY WITH PROPOFOL;  Surgeon: Josefine Class, MD;  Location: [facility] ENDOSCOPY;  Service: Endoscopy;  Laterality: N/A;  . Esophagogastroduodenoscopy (egd) with propofol N/A 05/05/2015    Procedure: ESOPHAGOGASTRODUODENOSCOPY (EGD) WITH PROPOFOL;   Surgeon: Josefine Class, MD;  Location: [facility] ENDOSCOPY;  Service: Endoscopy;  Laterality: N/A;    Current Outpatient Rx  Name  Route  Sig  Dispense  Refill  . ferrous sulfate 325 (65 FE) MG tablet   Oral   Take 1 tablet (325 mg total) by mouth 2 (two) times daily with a meal.   180 tablet   0   . FLUoxetine (PROZAC) 10 MG capsule   Oral   Take 10 mg by mouth daily.         . fluPHENAZine (PROLIXIN) 10 MG tablet   Oral   Take 10 mg by mouth daily.         . fluPHENAZine (PROLIXIN) 10 MG tablet   Oral   Take 15 mg by mouth at bedtime.         Marland Kitchen levothyroxine (SYNTHROID, LEVOTHROID) 112 MCG tablet   Oral   Take 112 mcg by mouth daily before breakfast.         . pantoprazole (PROTONIX) 40 MG tablet   Oral   Take 1 tablet (40 mg total) by mouth daily.   30 tablet   0   . QUEtiapine (SEROQUEL) 200 MG tablet   Oral   Take 200 mg by mouth 2 (two) times daily.         . QUEtiapine (SEROQUEL) 400 MG tablet   Oral   Take 400 mg by mouth at bedtime.         . sertraline (ZOLOFT) 100 MG tablet   Oral   Take 150 mg by mouth daily.         . traZODone (  DESYREL) 100 MG tablet   Oral   Take 200 mg by mouth at bedtime.           Allergies Review of patient's allergies indicates no known allergies.  Family History  Problem Relation Age of Onset  . Hypertension Other     Social History Social History  Substance Use Topics  . Smoking status: Current Every Day Smoker -- 0.50 packs/day    Types: Cigarettes  . Smokeless tobacco: None  . Alcohol Use: No    Review of Systems  Constitutional: Negative for fever. Eyes: Negative for visual changes. ENT: Negative for sore throat. Cardiovascular: Negative for chest pain. Respiratory: Negative for shortness of breath. Gastrointestinal: Negative for abdominal pain, vomiting or diarrhea. + dark colored stools Genitourinary: Negative for dysuria. Musculoskeletal: Negative for back pain. Skin: Negative  for rash. Neurological: Negative for headaches, weakness or numbness. + Syncope  ____________________________________________   PHYSICAL EXAM:  VITAL SIGNS: Filed Vitals:   08/28/15 0956  BP: 127/79  Pulse: 100  Temp: 98.2 F (36.8 C)  Resp: 18    Constitutional: Alert and oriented. Well appearing and in no apparent distress. HEENT:      Head: Normocephalic and atraumatic.         Eyes: Conjunctivae are normal. Sclera is non-icteric. EOMI. PERRL      Mouth/Throat: Mucous membranes are moist.       Neck: Supple with no signs of meningismus.No midline C-spine tenderness Cardiovascular: Regular rate and rhythm. No murmurs, gallops, or rubs. 2+ symmetrical distal pulses are present in all extremities. No JVD. Respiratory: Normal respiratory effort. Lungs are clear to auscultation bilaterally. No wheezes, crackles, or rhonchi.  Gastrointestinal: Soft, non tender, and non distended with positive bowel sounds. No rebound or guarding. rectal exam showing dark colored stool Hemoccult positive  Genitourinary: No suprapubic tenderness. No CVA tenderness. Musculoskeletal: Nontender with normal range of motion in all extremities. No edema, cyanosis, or erythema of extremities. Neurologic: Normal speech and language. Face is symmetric. Moving all extremities. No gross focal neurologic deficits are appreciated. Skin: Skin is warm, dry and intact. No rash noted. Psychiatric: Mood and affect are normal. Speech and behavior are normal.  ____________________________________________   LABS (all labs ordered are listed, but only abnormal results are displayed)  Labs Reviewed  BASIC METABOLIC PANEL - Abnormal; Notable for the following:    Glucose, Bld 108 (*)    Creatinine, Ser 1.47 (*)    GFR calc non Af Amer 50 (*)    GFR calc Af Amer 58 (*)    All other components within normal limits  CBC WITH DIFFERENTIAL/PLATELET - Abnormal; Notable for the following:    WBC 14.7 (*)    RBC 4.06 (*)      Hemoglobin 11.9 (*)    HCT 34.4 (*)    Neutro Abs 13.1 (*)    Lymphs Abs 0.6 (*)    All other components within normal limits  URINALYSIS COMPLETEWITH MICROSCOPIC ([facility] ONLY) - Abnormal; Notable for the following:    Color, Urine YELLOW (*)    APPearance CLEAR (*)    Ketones, ur TRACE (*)    Squamous Epithelial / LPF 0-5 (*)    All other components within normal limits  PROTIME-INR - Abnormal; Notable for the following:    Prothrombin Time 16.0 (*)    All other components within normal limits  APTT  TROPONIN I  GLUCOSE, CAPILLARY  TYPE AND SCREEN   ____________________________________________   ED ECG REPORT I, [place]  Alfred Levins, the attending physician, personally viewed and interpreted this ECG.  Normal sinus rhythm, rate of 96, normal intervals, normal axis, no ST elevations or depressions. ____________________________________________  RADIOLOGY  CT head: negative ____________________________________________   PROCEDURES  Procedure(s) performed: None Critical Care performed:  None ____________________________________________   INITIAL IMPRESSION / ASSESSMENT AND PLAN / ED COURSE   60 y.o. male the history of PUD with prior UGIB, hypertension, schizophrenia, mental retardation, hypothyroidism, dementia who presents for evaluation of syncope in the setting of a few days of dark colored stools. Patient's hemodynamically stable, physical exam is unremarkable, rectal exam showing dark colored stools that are Hemoccult positive. Presentation concerning for syncope in the setting of a GI bleed. We'll get 2 peripheral IVs, type and screen, CBC, BMP, given IV Protonix, IV fluids, and discussed patient with GI service for admission. Since patient was found face down after syncopal episode will get a head CT to rule out intracranial pathology.  ----------------------------------------- 11:32 AM on 08/28/2015 -----------------------------------------  Head CT with no  evidence of trauma. Slightly elevated kidney function at 1.47 (baseline 1.2), hemoglobin stable at 11.2. Patient remains hemodynamically stable however due to the fact the patient has mental retardation and is unable to tell me how many days is been having melena and also has a history of peptic ulcer disease will admit patient for observation and trending his hemoglobin. Discussed with the hospitalist will accept the patient.  Pertinent labs & imaging results that were available during my care of the patient were reviewed by me and considered in my medical decision making (see chart for details).    ____________________________________________   FINAL CLINICAL IMPRESSION(S) / ED DIAGNOSES  Final diagnoses:  UGIB (upper gastrointestinal bleed)  Syncope, unspecified syncope type      NEW MEDICATIONS STARTED DURING THIS VISIT:  New Prescriptions   No medications on file     Note:  This document was prepared using Dragon voice recognition software and may include unintentional dictation errors.    Rudene Re, MD 08/28/15 1134

## 2015-08-28 NOTE — ED Notes (Signed)
Group Home caregivers are [facility] or [facility] and can be reached at [phone].

## 2015-08-28 NOTE — Progress Notes (Signed)
Patient admitted via the ER after a fall in a parking lot. No signs of distress. Denies pain.  Has a psoriasis type rough skin patches on his lower legs, bilat.  NS started at 75 ml/hour.  Confused but pleasant gentleman.

## 2015-08-28 NOTE — ED Notes (Signed)
Ems found face down in a [employer]" pt alert x2 , clothing was reported damp by EMS, CBG 75. Pt reported missing from local group home this AM , pt report "my brother put me in jail" , [employer] officer reports " pt gets upset with group home because he not aloud to smoke there", pt calm and cooperative, short one word answers from pt.

## 2015-08-28 NOTE — H&P (Signed)
[facility] at [place] NAME: Brandon Singh    MR#:  [id number]  DATE OF BIRTH:  1955/05/17  DATE OF ADMISSION:  08/28/2015  PRIMARY CARE PHYSICIAN: Elba Barman, MD   REQUESTING/REFERRING PHYSICIAN: Dr. Rudene Re  CHIEF COMPLAINT: altered mental status    Chief Complaint  Patient presents with  . Altered Mental Status    HISTORY OF PRESENT ILLNESS:  Brandon Singh  is a 60 y.o. male with a known history of Peptic ulcer disease with upper GI bleeding, EGD in March of this year showing duodenal ulcer, history of hypothyroidism, mental retardation, dementia presents for evaluation of syncope. Patient left the group home today morning because they did not let him smoking date. Found in [employer] down. Very poor historian. He does not know what happened and why he is here.  He is alreadyeating crackers and I went to see him. Essential dementia. Unable to tell me whether he has black stool.  guaic positive in the emergency room.  PAST MEDICAL HISTORY:   Past Medical History  Diagnosis Date  . Hypertension   . Schizophrenia ([place])   . Mental retardation   . Hypothyroidism   . Dementia   . Sleep apnea     PAST SURGICAL HISTOIRY:   Past Surgical History  Procedure Laterality Date  . Colonoscopy with propofol N/A 04/11/2015    Procedure: COLONOSCOPY WITH PROPOFOL;  Surgeon: Josefine Class, MD;  Location: [facility] ENDOSCOPY;  Service: Endoscopy;  Laterality: N/A;  . Esophagogastroduodenoscopy (egd) with propofol N/A 05/05/2015    Procedure: ESOPHAGOGASTRODUODENOSCOPY (EGD) WITH PROPOFOL;  Surgeon: Josefine Class, MD;  Location: [facility] ENDOSCOPY;  Service: Endoscopy;  Laterality: N/A;    SOCIAL HISTORY:   Social History  Substance Use Topics  . Smoking status: Current Every Day Smoker -- 0.50 packs/day    Types: Cigarettes  . Smokeless tobacco: Not on file  . Alcohol Use: No    FAMILY HISTORY:   Family  History  Problem Relation Age of Onset  . Hypertension Other     DRUG ALLERGIES:  No Known Allergies  REVIEW OF SYSTEMS: Not obtainable  because of mental retardation, dementia.    MEDICATIONS AT HOME:   Prior to Admission medications   Medication Sig Start Date End Date Taking? Authorizing Provider  ferrous sulfate 325 (65 FE) MG tablet Take 1 tablet (325 mg total) by mouth 2 (two) times daily with a meal. 03/24/15   Srikar Sudini, MD  FLUoxetine (PROZAC) 10 MG capsule Take 10 mg by mouth daily.    Historical Provider, MD  fluPHENAZine (PROLIXIN) 10 MG tablet Take 10 mg by mouth daily.    Historical Provider, MD  fluPHENAZine (PROLIXIN) 10 MG tablet Take 15 mg by mouth at bedtime.    Historical Provider, MD  levothyroxine (SYNTHROID, LEVOTHROID) 112 MCG tablet Take 112 mcg by mouth daily before breakfast.    Historical Provider, MD  pantoprazole (PROTONIX) 40 MG tablet Take 1 tablet (40 mg total) by mouth daily. 03/24/15   Srikar Sudini, MD  QUEtiapine (SEROQUEL) 200 MG tablet Take 200 mg by mouth 2 (two) times daily.    Historical Provider, MD  QUEtiapine (SEROQUEL) 400 MG tablet Take 400 mg by mouth at bedtime.    Historical Provider, MD  sertraline (ZOLOFT) 100 MG tablet Take 150 mg by mouth daily.    Historical Provider, MD  traZODone (DESYREL) 100 MG tablet Take 200 mg by mouth at  bedtime.    Historical Provider, MD      VITAL SIGNS:  Blood pressure 127/79, pulse 100, temperature 98.2 F (36.8 C), temperature source Oral, resp. rate 18, height 5\' 10"  (1.778 m), weight 81.647 kg (180 lb), SpO2 99 %.  PHYSICAL EXAMINATION:  GENERAL:  60 y.o.-year-old patient lying in the bed with no acute distress.  EYES: Pupils equal, round, reactive to light and accommodation. No scleral icterus. Extraocular muscles intact.  HEENT: Head atraumatic, normocephalic. Oropharynx and nasopharynx clear.  NECK:  Supple, no jugular venous distention. No thyroid enlargement, no tenderness.  LUNGS:  Normal breath sounds bilaterally, no wheezing, rales,rhonchi or crepitation. No use of accessory muscles of respiration.  CARDIOVASCULAR: S1, S2 normal. No murmurs, rubs, or gallops.  ABDOMEN: Soft, nontender, nondistended. Bowel sounds present. No organomegaly or mass.  EXTREMITIES: No pedal edema, cyanosis, or clubbing.  NEUROLOGIC: Cranial nerves II through XII are intact. Muscle strength 5/5 in all extremities. Sensation intact. Gait not checked.  PSYCHIATRIC: The patient is awake but demented.  SKIN: No obvious rash, lesion, or ulcer.   LABORATORY PANEL:   CBC  Recent Labs Lab 08/28/15 1000  WBC 14.7*  HGB 11.9*  HCT 34.4*  PLT 154   ------------------------------------------------------------------------------------------------------------------  Chemistries   Recent Labs Lab 08/28/15 1000  NA 138  K 4.3  CL 104  CO2 28  GLUCOSE 108*  BUN 20  CREATININE 1.47*  CALCIUM 8.9   ------------------------------------------------------------------------------------------------------------------  Cardiac Enzymes  Recent Labs Lab 08/28/15 1000  TROPONINI <0.03   ------------------------------------------------------------------------------------------------------------------  RADIOLOGY:  Ct Head Wo Contrast  08/28/2015  CLINICAL DATA:  Fall with syncope. Initial encounter. EXAM: CT HEAD WITHOUT CONTRAST TECHNIQUE: Contiguous axial images were obtained from the base of the skull through the vertex without intravenous contrast. COMPARISON:  None. FINDINGS: Brain: No evidence of acute infarction, hemorrhage, hydrocephalus, or mass lesion/mass effect. Vascular: No hyperdense vessel or unexpected calcification. Skull: Subcutaneous reticulation on the forehead could be scarring or contusion. Negative for fracture. Scalp calcifications without convincing foreign body. Prominent right parietal foramen emissary vein, incidental in this clinical setting. Sinuses/Orbits: No acute  finding. Other: None. IMPRESSION: Negative for intracranial injury or fracture. Electronically Signed   By: Monte Fantasia M.D.   On: 08/28/2015 10:40    EKG:   Orders placed or performed during the hospital encounter of 03/21/15  . ED EKG  . ED EKG    IMPRESSION AND PLAN:  #1 .syncope ;vasovagal?' secondary to dehydration, acute renal failure: Admit to telemetry to observation status, start IV hydration, monitor for any arrhythmias. The head unremarkable for any acute changes. EKG showed normal sinus rhythm. He is a awake but has baseline dementia. #2 melena, history of GI bleed with duodenal ulcer, EGD in march 2017 showing duodenal ulcer. 9. Patient hemodynamically stable. Hemoglobin 11.9/hematocrit 34.4.Marland Kitchen Patient colonoscopy in March was  normal. Continue PPIs. IV hydration. GI is consulted. 3. acute renal failure due to ATN: Continue IV hydration, check renal function tomorrow. #4 /history of schizophrenia, mental retardation: Continue home medications. #5 .history of hypothyroidism: Continue levothyroxine supplements. Likely discharge tomorrow to   Group home if  thermodynamically stable.   All the records are reviewed and case discussed with ED provider. Management plans discussed with the patient, family and they are in agreement.  CODE STATUS: full  TOTAL TIME TAKING CARE OF THIS PATIENT: 55 minutes.    Epifanio Lesches M.D on 08/28/2015 at 12:06 PM  Between 7am to 6pm - Pager - [phone]  After 6pm go  to [email or web address] - password [email or web address] [facility]  Office  [phone]  CC: Primary care physician; Elba Barman, MD  Note: This dictation was prepared with Dragon dictation along with smaller phrase technology. Any transcriptional errors that result from this process are unintentional.

## 2015-08-28 NOTE — ED Notes (Signed)
Hemoccult

## 2015-08-28 NOTE — ED Notes (Signed)
Budd at group home contacted to inform that pt will be admitted.

## 2015-08-29 DIAGNOSIS — R55 Syncope and collapse: Secondary | ICD-10-CM | POA: Diagnosis not present

## 2015-08-29 LAB — CBC
HCT: 32.7 % — ABNORMAL LOW (ref 40.0–52.0)
HEMOGLOBIN: 11.1 g/dL — AB (ref 13.0–18.0)
MCH: 29.2 pg (ref 26.0–34.0)
MCHC: 33.8 g/dL (ref 32.0–36.0)
MCV: 86.2 fL (ref 80.0–100.0)
PLATELETS: 143 10*3/uL — AB (ref 150–440)
RBC: 3.8 MIL/uL — ABNORMAL LOW (ref 4.40–5.90)
RDW: 13.6 % (ref 11.5–14.5)
WBC: 9.2 10*3/uL (ref 3.8–10.6)

## 2015-08-29 LAB — BASIC METABOLIC PANEL
ANION GAP: 2 — AB (ref 5–15)
BUN: 12 mg/dL (ref 6–20)
CALCIUM: 8 mg/dL — AB (ref 8.9–10.3)
CO2: 27 mmol/L (ref 22–32)
CREATININE: 1.09 mg/dL (ref 0.61–1.24)
Chloride: 112 mmol/L — ABNORMAL HIGH (ref 101–111)
GFR calc Af Amer: >60 mL/min (ref >60)
GLUCOSE: 85 mg/dL (ref 65–99)
Potassium: 3.5 mmol/L (ref 3.5–5.1)
Sodium: 141 mmol/L (ref 135–145)

## 2015-08-29 LAB — GLUCOSE, CAPILLARY: GLUCOSE-CAPILLARY: 79 mg/dL (ref 65–99)

## 2015-08-29 LAB — HEMOGLOBIN: Hemoglobin: 11.4 g/dL — ABNORMAL LOW (ref 13.0–18.0)

## 2015-08-29 MED ORDER — PANTOPRAZOLE SODIUM 40 MG PO TBEC: 40.0000 mg | DELAYED_RELEASE_TABLET | Freq: Two times a day (BID) | ORAL | Status: AC

## 2015-08-29 MED ORDER — PANTOPRAZOLE SODIUM 40 MG PO TBEC: 40.0000 mg | DELAYED_RELEASE_TABLET | Freq: Two times a day (BID) | ORAL | Status: DC

## 2015-08-29 MED ORDER — PANTOPRAZOLE SODIUM 40 MG PO TBEC
40.0000 mg | DELAYED_RELEASE_TABLET | Freq: Two times a day (BID) | ORAL | Status: DC
  Administered 2015-08-29: 40 mg via ORAL
  Filled 2015-08-29: qty 1

## 2015-08-29 NOTE — Discharge Summary (Signed)
[facility] at [place] NAME: Brandon Singh    MR#:  [id number]  DATE OF BIRTH:  [birth year]  DATE OF ADMISSION:  08/28/2015 ADMITTING PHYSICIAN: Epifanio Lesches, MD  DATE OF DISCHARGE: 08/29/2015  PRIMARY CARE PHYSICIAN: Elba Barman, MD    ADMISSION DIAGNOSIS:  UGIB (upper gastrointestinal bleed) [K92.2] Syncope, unspecified syncope type [R55]  DISCHARGE DIAGNOSIS:  Active Problems:   Syncope, vasovagal   SECONDARY DIAGNOSIS:   Past Medical History  Diagnosis Date  . Hypertension   . Schizophrenia ([place])   . Mental retardation   . Hypothyroidism   . Dementia   . Sleep apnea     HOSPITAL COURSE:   60 year old male with past medical history significant for duodenal ulcer noted during EGD in March 2017, mental retardation, dementia, hypothyroidism and schizophrenia presents from a group home secondary to syncope.  #1 syncope-vasovagal, also from hypotension. -Due to dehydration. Patient left the group home to smoke and was found near [employer]. -Improved blood pressure with IV fluids. Orthostatics pending this morning. If stable, he can be discharged home. -Concern for GI bleed noted on admission. Hemoglobin has remained stable in 24 hours. -No melena noted here. -EGD in March 2017 shows a healing duodenal ulcer with no stigmata of recent bleeding. He is Protonix is being increased to twice a day at this time.  #2 peptic ulcer disease-has duodenal ulcer noted on EGD and multiple gastric erosions. -No NSAIDS - increase protonix to BID -Patient GI follow up in 3-4 weeks.  #3 schizophrenia, mental retardation, dementia-seems to be at baseline. Flat affect. Continue home medications. On high doses of Seroquel, Prolixin, Prozac, trazodone and Zoloft -Outpatient psychiatry follow-up  #4 acute renal failure-prerenal. Improved with IV fluids and back to baseline now.  #5 hypothyroidism-continue Synthroid  Being  discharged today.   DISCHARGE CONDITIONS:   Guarded  CONSULTS OBTAINED:  Treatment Team:  Lollie Sails, MD  DRUG ALLERGIES:  No Known Allergies  DISCHARGE MEDICATIONS:   Current Discharge Medication List    CONTINUE these medications which have CHANGED   Details  pantoprazole (PROTONIX) 40 MG tablet Take 1 tablet (40 mg total) by mouth 2 (two) times daily. Qty: 60 tablet, Refills: 2      CONTINUE these medications which have NOT CHANGED   Details  ferrous sulfate 325 (65 FE) MG tablet Take 1 tablet (325 mg total) by mouth 2 (two) times daily with a meal. Qty: 180 tablet, Refills: 0    FLUoxetine (PROZAC) 10 MG capsule Take 10 mg by mouth daily.    !! fluPHENAZine (PROLIXIN) 10 MG tablet Take 10 mg by mouth daily.    !! fluPHENAZine (PROLIXIN) 10 MG tablet Take 15 mg by mouth at bedtime.    levothyroxine (SYNTHROID, LEVOTHROID) 112 MCG tablet Take 112 mcg by mouth daily before breakfast.    !! QUEtiapine (SEROQUEL) 200 MG tablet Take 200 mg by mouth 2 (two) times daily.    !! QUEtiapine (SEROQUEL) 400 MG tablet Take 400 mg by mouth at bedtime.    sertraline (ZOLOFT) 100 MG tablet Take 150 mg by mouth daily.    traZODone (DESYREL) 100 MG tablet Take 200 mg by mouth at bedtime.     !! - Potential duplicate medications found. Please discuss with provider.       DISCHARGE INSTRUCTIONS:   1. PCP f/u in 1-2 weeks 2. Follow-up with psychiatry in 1-2 weeks 3. GI follow-up in 3-4 weeks.  If you  experience worsening of your admission symptoms, develop shortness of breath, life threatening emergency, suicidal or homicidal thoughts you must seek medical attention immediately by calling 911 or calling your MD immediately  if symptoms less severe.  You Must read complete instructions/literature along with all the possible adverse reactions/side effects for all the Medicines you take and that have been prescribed to you. Take any new Medicines after you have completely  understood and accept all the possible adverse reactions/side effects.   Please note  You were cared for by a hospitalist during your hospital stay. If you have any questions about your discharge medications or the care you received while you were in the hospital after you are discharged, you can call the unit and asked to speak with the hospitalist on call if the hospitalist that took care of you is not available. Once you are discharged, your primary care physician will handle any further medical issues. Please note that NO REFILLS for any discharge medications will be authorized once you are discharged, as it is imperative that you return to your primary care physician (or establish a relationship with a primary care physician if you do not have one) for your aftercare needs so that they can reassess your need for medications and monitor your lab values.    Today   CHIEF COMPLAINT:   Chief Complaint  Patient presents with  . Altered Mental Status    VITAL SIGNS:  Blood pressure 116/67, pulse 84, temperature 98.4 F (36.9 C), temperature source Oral, resp. rate 20, height 5\' 10"  (1.778 m), weight 78.336 kg (172 lb 11.2 oz), SpO2 96 %.  I/O:   Intake/Output Summary (Last 24 hours) at 08/29/15 1028 Last data filed at 08/29/15 0700  Gross per 24 hour  Intake 1442.5 ml  Output    600 ml  Net  842.5 ml    PHYSICAL EXAMINATION:   Physical Exam  GENERAL:  60 y.o.-year-old patient lying in the bed with no acute distress.  EYES: Pupils equal, round, reactive to light and accommodation. No scleral icterus. Extraocular muscles intact.  HEENT: Head atraumatic, normocephalic. Oropharynx and nasopharynx clear.  NECK:  Supple, no jugular venous distention. No thyroid enlargement, no tenderness.  LUNGS: Normal breath sounds bilaterally, no wheezing, rales,rhonchi or crepitation. No use of accessory muscles of respiration.  CARDIOVASCULAR: S1, S2 normal. No murmurs, rubs, or gallops.   ABDOMEN: Soft, non-tender, non-distended. Bowel sounds present. No organomegaly or mass.  EXTREMITIES: No pedal edema, cyanosis, or clubbing.  NEUROLOGIC: Cranial nerves II through XII are intact. Muscle strength 5/5 in all extremities. Sensation intact. Gait not checked. Answering only simple questions in Yes or No. PSYCHIATRIC: The patient is alert and oriented to self, flat affect. Calm and composed.  SKIN: No obvious rash, lesion, or ulcer.   DATA REVIEW:   CBC  Recent Labs Lab 08/29/15 0450 08/29/15 0933  WBC 9.2  --   HGB 11.1* 11.4*  HCT 32.7*  --   PLT 143*  --     Chemistries   Recent Labs Lab 08/29/15 0450  NA 141  K 3.5  CL 112*  CO2 27  GLUCOSE 85  BUN 12  CREATININE 1.09  CALCIUM 8.0*    Cardiac Enzymes  Recent Labs Lab 08/28/15 1000  TROPONINI <0.03    Microbiology Results  Results for orders placed or performed during the hospital encounter of 08/28/15  MRSA PCR Screening     Status: Abnormal   Collection Time: 08/28/15  5:41 PM  Result Value Ref Range Status   MRSA by PCR POSITIVE (A) NEGATIVE Final    Comment:        The GeneXpert MRSA Assay (FDA approved for NASAL specimens only), is one component of a comprehensive MRSA colonization surveillance program. It is not intended to diagnose MRSA infection nor to guide or monitor treatment for MRSA infections. CRITICAL RESULT CALLED TO, READ BACK BY AND VERIFIED WITH: CALLED MARCELL TURNER 08/28/15 @ 1940  [place]:  Ct Head Wo Contrast  08/28/2015  CLINICAL DATA:  Fall with syncope. Initial encounter. EXAM: CT HEAD WITHOUT CONTRAST TECHNIQUE: Contiguous axial images were obtained from the base of the skull through the vertex without intravenous contrast. COMPARISON:  None. FINDINGS: Brain: No evidence of acute infarction, hemorrhage, hydrocephalus, or mass lesion/mass effect. Vascular: No hyperdense vessel or unexpected calcification. Skull: Subcutaneous reticulation on the  forehead could be scarring or contusion. Negative for fracture. Scalp calcifications without convincing foreign body. Prominent right parietal foramen emissary vein, incidental in this clinical setting. Sinuses/Orbits: No acute finding. Other: None. IMPRESSION: Negative for intracranial injury or fracture. Electronically Signed   By: Monte Fantasia M.D.   On: 08/28/2015 10:40    EKG:   Orders placed or performed during the hospital encounter of 03/21/15  . ED EKG  . ED EKG      Management plans discussed with the patient, family and they are in agreement.  CODE STATUS:     Code Status Orders        Start     Ordered   08/28/15 1146  Full code   Continuous     08/28/15 1156    Code Status History    Date Active Date Inactive Code Status Order ID Comments User Context   03/21/2015 10:52 PM 03/24/2015  6:59 PM Full Code [id number]  Lytle Butte, MD ED      TOTAL TIME TAKING CARE OF THIS PATIENT: 37 minutes.    Gladstone Lighter M.D on 08/29/2015 at 10:28 AM  Between 7am to 6pm - Pager - [phone]  After 6pm go to [email or web address] - password [email or web address] [facility]  Office  [phone]  CC: Primary care physician; Elba Barman, MD

## 2015-08-29 NOTE — NC FL2 (Signed)
[place] LEVEL OF CARE SCREENING TOOL     IDENTIFICATION  Patient Name: Brandon Singh Birthdate: 1955/08/11 Sex: male Admission Date (Current Location): 08/28/2015  [place] and [place] Number:  Engineering geologist and Address:  [facility], [address] [place], [place] [address]      Provider Number: [id number]  Attending Physician Name and Address:  Gladstone Lighter, MD  Relative Name and Phone Number:       Current Level of Care: Hospital Recommended Level of Care: Other (Comment) (Group Home) Prior Approval Number:    Date Approved/Denied:   PASRR Number:    Discharge Plan: Other (Comment) (Group Home)    Current Diagnoses: Patient Active Problem List   Diagnosis Date Noted  . Syncope, vasovagal 08/28/2015  . Bright red blood per rectum 03/21/2015  . Syncope 03/21/2015    Orientation RESPIRATION BLADDER Height & Weight     Self, Time, Situation, Place  Normal Continent Weight: 172 lb 11.2 oz (78.336 kg) Height:  5\' 10"  (177.8 cm)  BEHAVIORAL SYMPTOMS/MOOD NEUROLOGICAL BOWEL NUTRITION STATUS   (None)  (None) Continent Diet  AMBULATORY STATUS COMMUNICATION OF NEEDS Skin   Independent Verbally Normal                       Personal Care Assistance Level of Assistance  Bathing, Feeding, Dressing Bathing Assistance: Independent Feeding assistance: Independent Dressing Assistance: Independent     Functional Limitations Info  Sight, Hearing, Speech Sight Info: Adequate Hearing Info: Adequate Speech Info: Adequate    SPECIAL CARE FACTORS FREQUENCY                       Contractures      Additional Factors Info  Psychotropic, Isolation Precautions, Code Status, Allergies Code Status Info:  (Full Code) Allergies Info:  (No Known Allergies ) Psychotropic Info:  (traZODone (DESYREL) tablet 200 mg 200 mg, Oral, Daily at bedtime ; QUEtiapine (SEROQUEL) tablet 200 mg 200 mg, Oral, 2 times daily ;  sertraline (ZOLOFT) tablet 150 mg 150 mg, Oral, Daily  )   Isolation Precautions Info:  (Contact Precautions- MRSA)      DISCHARGE MEDICATIONS:   Current Discharge Medication List    CONTINUE these medications which have CHANGED   Details  pantoprazole (PROTONIX) 40 MG tablet Take 1 tablet (40 mg total) by mouth 2 (two) times daily. Qty: 60 tablet, Refills: 2      CONTINUE these medications which have NOT CHANGED   Details  ferrous sulfate 325 (65 FE) MG tablet Take 1 tablet (325 mg total) by mouth 2 (two) times daily with a meal. Qty: 180 tablet, Refills: 0    FLUoxetine (PROZAC) 10 MG capsule Take 10 mg by mouth daily.    !! fluPHENAZine (PROLIXIN) 10 MG tablet Take 10 mg by mouth daily.    !! fluPHENAZine (PROLIXIN) 10 MG tablet Take 15 mg by mouth at bedtime.    levothyroxine (SYNTHROID, LEVOTHROID) 112 MCG tablet Take 112 mcg by mouth daily before breakfast.    !! QUEtiapine (SEROQUEL) 200 MG tablet Take 200 mg by mouth 2 (two) times daily.    !! QUEtiapine (SEROQUEL) 400 MG tablet Take 400 mg by mouth at bedtime.    sertraline (ZOLOFT) 100 MG tablet Take 150 mg by mouth daily.    traZODone (DESYREL) 100 MG tablet Take 200 mg by mouth at bedtime.    !! - Potential duplicate medications found. Please  discuss with provider.           Relevant Imaging Results:  Relevant Lab Results:   Additional Information  (SSN [id number])  Lorenso [place], LCSW

## 2015-08-29 NOTE — Progress Notes (Signed)
Discharged via wheelchair with caregivers, packet given to them.

## 2015-08-29 NOTE — Care Management Obs Status (Signed)
[place] NOTIFICATION   Patient Details  Name: Brandon Singh MRN: [id number] Date of Birth: May 07, 1955   Medicare Observation Status Notification Given:  Yes Group Home representative signed   Katrina Stack, RN 08/29/2015, 12:26 PM

## 2015-08-29 NOTE — Clinical Social Work Note (Signed)
Clinical Social Work Assessment  Patient Details  Name: Brandon Singh MRN: [id number] Date of Birth: Aug 31, 1955  Date of referral:  08/29/15               Reason for consult:  Discharge Planning                Permission sought to share information with:  Family Supports Permission granted to share information::  Yes, Verbal Permission Granted  Name::        Agency::     Relationship::   (Iva- Mother)  Contact Information:     Housing/Transportation Living arrangements for the past 2 months:  Group Home Source of Information:  Patient, Other (Comment Required) ([facility] Staff) Patient Interpreter Needed:  None Criminal Activity/Legal Involvement Pertinent to Current Situation/Hospitalization:  No - Comment as needed Significant Relationships:  Siblings, Parents Lives with:  Facility Resident ([facility]'s DDA) Do you feel safe going back to the place where you live?  Yes Need for family participation in patient care:  Yes (Comment) (Patient has a legal guardian)  Care giving concerns:  Patient is from [facility] Worker assessment / plan:  CSW met with patient at bedside. Introduced herself and her role. Per patient he's from [facility] in [place] and plans to discharge back there today. Granted CSW verbal permission to contact Katharine Look to coordinate discharge plans. Verbal permission granted to speak to his family located on chart (Mother and brother). Attempted to call each but no one answered. Patient's mother's number does not work. Patient's chart reports that he has a legal guardian. Patient could not provided CSW with this information. CSW attempted to call [place] to determine who patient's legal guardian is. No answer. CSW called [facility] [place] and spoke to Owner Budd. Per Bud patient can return today and he'll transport patient at discharge. CSW called back to obtain patient's Legal guardian  information. No answer left voicemail.  FL2 completed and placed in MDs basket.   Employment status:  Disabled (Comment on whether or not currently receiving Disability) Insurance information:  Medicare PT Recommendations:  Not assessed at this time Information / Referral to community resources:     Patient/Family's Response to care:  Patient is in agreement to discharge back to [facility].   Patient/Family's Understanding of and Emotional Response to Diagnosis, Current Treatment, and Prognosis:  Patient has poor understanding of his diagnosis and prognosis but appreciated CSW for her assistance.   Emotional Assessment Appearance:  Appears stated age Attitude/Demeanor/Rapport:   (None) Affect (typically observed):  Calm, Pleasant Orientation:  Oriented to Self, Oriented to Place, Oriented to  Time, Oriented to Situation Alcohol / Substance use:  Not Applicable Psych involvement (Current and /or in the community):  No (Comment)  Discharge Needs  Concerns to be addressed:  Discharge Planning Concerns Readmission within the last 30 days:  No Current discharge risk:  None Barriers to Discharge:  No Barriers Identified   [place], LCSW 08/29/2015, 11:16 AM

## 2015-08-29 NOTE — Progress Notes (Addendum)
Pt being discharged back to [facility] group home. Orthostatics negative, per MD proceed with discharge. Ride has been notified and will be here within next few minutes. IV and TELE removed per protocol. Pt assessment unchanged from this AM. CSW packet will be given to caregiver.

## 2015-09-23 ENCOUNTER — Other Ambulatory Visit: Payer: Self-pay

## 2015-09-23 DIAGNOSIS — F79 Unspecified intellectual disabilities: Secondary | ICD-10-CM | POA: Insufficient documentation

## 2015-09-23 DIAGNOSIS — F209 Schizophrenia, unspecified: Secondary | ICD-10-CM | POA: Insufficient documentation

## 2015-09-23 DIAGNOSIS — G4730 Sleep apnea, unspecified: Secondary | ICD-10-CM | POA: Insufficient documentation

## 2015-09-23 DIAGNOSIS — F2081 Schizophreniform disorder: Secondary | ICD-10-CM

## 2015-09-23 DIAGNOSIS — E039 Hypothyroidism, unspecified: Secondary | ICD-10-CM

## 2015-09-23 DIAGNOSIS — I10 Essential (primary) hypertension: Secondary | ICD-10-CM | POA: Insufficient documentation

## 2015-09-24 ENCOUNTER — Ambulatory Visit (INDEPENDENT_AMBULATORY_CARE_PROVIDER_SITE_OTHER): Payer: Medicare Other | Admitting: Gastroenterology

## 2015-09-24 ENCOUNTER — Other Ambulatory Visit: Payer: Self-pay

## 2015-09-24 ENCOUNTER — Encounter: Payer: Self-pay | Admitting: Gastroenterology

## 2015-09-24 VITALS — BP 103/64 | HR 72 | Temp 97.9°F | Ht 68.0 in | Wt 173.0 lb

## 2015-09-24 DIAGNOSIS — B9681 Helicobacter pylori [H. pylori] as the cause of diseases classified elsewhere: Secondary | ICD-10-CM

## 2015-09-24 DIAGNOSIS — A048 Other specified bacterial intestinal infections: Secondary | ICD-10-CM

## 2015-09-25 NOTE — Progress Notes (Signed)
Gastroenterology Consultation  Referring Provider:     Elba Barman, MD Primary Care Physician:  Elba Barman, MD Primary Gastroenterologist:  Dr. Allen Norris     Reason for Consultation:     Follow-up from the ER        HPI:   Brandon Singh is a 60 y.o. y/o male referred for consultation & management of Follow-up from the ER by Dr. Loma Newton, Honor Loh, MD.  This patient comes today with a care provider with a history of H. pylori. The patient was treated for his H. pylori by Dr. Rayann Heman. He was recently admitted to the hospital for dehydration. She was thought to have a possible GI bleed despite having a stable hemoglobin and no melena. The patient had a history of a duodenal ulcer on a previous EGD.   Past Medical History:  Diagnosis Date  . Dementia   . Hypertension   . Hypothyroidism   . Mental retardation   . Schizophrenia ([place])   . Sleep apnea     Past Surgical History:  Procedure Laterality Date  . COLONOSCOPY WITH PROPOFOL N/A 04/11/2015   Procedure: COLONOSCOPY WITH PROPOFOL;  Surgeon: Josefine Class, MD;  Location: [facility] ENDOSCOPY;  Service: Endoscopy;  Laterality: N/A;  . ESOPHAGOGASTRODUODENOSCOPY (EGD) WITH PROPOFOL N/A 05/05/2015   Procedure: ESOPHAGOGASTRODUODENOSCOPY (EGD) WITH PROPOFOL;  Surgeon: Josefine Class, MD;  Location: [facility] ENDOSCOPY;  Service: Endoscopy;  Laterality: N/A;    Prior to Admission medications   Medication Sig Start Date End Date Taking? Authorizing Provider  ferrous sulfate 325 (65 FE) MG tablet Take 1 tablet (325 mg total) by mouth 2 (two) times daily with a meal. 03/24/15  Yes Srikar Sudini, MD  FLUoxetine (PROZAC) 10 MG capsule Take 10 mg by mouth daily.   Yes Historical Provider, MD  fluPHENAZine (PROLIXIN) 10 MG tablet Take 10 mg by mouth daily.   Yes Historical Provider, MD  fluPHENAZine (PROLIXIN) 10 MG tablet Take 15 mg by mouth at bedtime.   Yes Historical Provider, MD  levothyroxine (SYNTHROID, LEVOTHROID) 112 MCG tablet  Take by mouth.   Yes Historical Provider, MD  omeprazole (PRILOSEC) 20 MG capsule TAKE (1) CAPSULE BY MOUTH ONCE DAILY. 05/19/15  Yes Historical Provider, MD  pantoprazole (PROTONIX) 40 MG tablet Take 1 tablet (40 mg total) by mouth 2 (two) times daily. 08/29/15  Yes Gladstone Lighter, MD  QUEtiapine (SEROQUEL) 200 MG tablet Take 200 mg by mouth 2 (two) times daily.   Yes Historical Provider, MD  QUEtiapine (SEROQUEL) 400 MG tablet Take 400 mg by mouth at bedtime.   Yes Historical Provider, MD  sertraline (ZOLOFT) 100 MG tablet Take 150 mg by mouth daily.   Yes Historical Provider, MD  traZODone (DESYREL) 100 MG tablet Take 200 mg by mouth at bedtime.   Yes Historical Provider, MD    Family History  Problem Relation Age of Onset  . Hypertension Other      Social History  Substance Use Topics  . Smoking status: Current Every Day Smoker    Packs/day: 0.50    Types: Cigarettes  . Smokeless tobacco: Never Used  . Alcohol use No    Allergies as of 09/24/2015  . (No Known Allergies)    Review of Systems:    All systems reviewed and negative except where noted in HPI.   Physical Exam:  BP 103/64   Pulse 72   Temp 97.9 F (36.6 C) (Oral)   Ht 5\' 8"  (1.727 m)  Wt 173 lb (78.5 kg)   BMI 26.30 kg/m  No LMP for male patient. Psych:  Alert and cooperative. Normal mood and affect. General:   Alert,  Well-developed, well-nourished, pleasant and cooperative in NAD Head:  Normocephalic and atraumatic. Eyes:  Sclera clear, no icterus.   Conjunctiva pink. Ears:  Normal auditory acuity. Nose:  No deformity, discharge, or lesions. Mouth:  No deformity or lesions,oropharynx pink & moist. Neck:  Supple; no masses or thyromegaly. Lungs:  Respirations even and unlabored.  Clear throughout to auscultation.   No wheezes, crackles, or rhonchi. No acute distress. Heart:  Regular rate and rhythm; no murmurs, clicks, rubs, or gallops. Abdomen:  Normal bowel sounds.  No bruits.  Soft, non-tender and  non-distended without masses, hepatosplenomegaly or hernias noted.  No guarding or rebound tenderness.  Negative Carnett sign.   Rectal:  Deferred.  Msk:  Symmetrical without gross deformities.  Good, equal movement & strength bilaterally. Pulses:  Normal pulses noted. Extremities:  No clubbing or edema.  No cyanosis. Neurologic:  Alert and oriented x3;  grossly normal neurologically. Skin:  Intact without significant lesions or rashes.  No jaundice. Lymph Nodes:  No significant cervical adenopathy. Psych:  Alert and cooperative. Normal mood and affect.  Imaging Studies: Ct Head Wo Contrast  Result Date: 08/28/2015 CLINICAL DATA:  Fall with syncope. Initial encounter. EXAM: CT HEAD WITHOUT CONTRAST TECHNIQUE: Contiguous axial images were obtained from the base of the skull through the vertex without intravenous contrast. COMPARISON:  None. FINDINGS: Brain: No evidence of acute infarction, hemorrhage, hydrocephalus, or mass lesion/mass effect. Vascular: No hyperdense vessel or unexpected calcification. Skull: Subcutaneous reticulation on the forehead could be scarring or contusion. Negative for fracture. Scalp calcifications without convincing foreign body. Prominent right parietal foramen emissary vein, incidental in this clinical setting. Sinuses/Orbits: No acute finding. Other: None. IMPRESSION: Negative for intracranial injury or fracture. Electronically Signed   By: Monte Fantasia M.D.   On: 08/28/2015 10:40    Assessment and Plan:   Brandon Singh is a 60 y.o. y/o male here to establish care with me after being treated by Dr. Rayann Heman who is no longer practicing in this area. The patient has a history of H. pylori and the duodenal ulcer. The patient will have his stools checked for H. pylori to make sure he has eradicated the bacteria. The patient's caregiver has been explained the plan and agrees with it.   Note: This dictation was prepared with Dragon dictation along with smaller phrase  technology. Any transcriptional errors that result from this process are unintentional.

## 2016-02-27 DIAGNOSIS — R21 Rash and other nonspecific skin eruption: Secondary | ICD-10-CM | POA: Diagnosis not present

## 2016-03-18 DIAGNOSIS — H60543 Acute eczematoid otitis externa, bilateral: Secondary | ICD-10-CM | POA: Diagnosis not present

## 2016-03-30 DIAGNOSIS — F29 Unspecified psychosis not due to a substance or known physiological condition: Secondary | ICD-10-CM | POA: Diagnosis not present

## 2016-03-30 DIAGNOSIS — F79 Unspecified intellectual disabilities: Secondary | ICD-10-CM | POA: Diagnosis not present

## 2016-04-01 DIAGNOSIS — H60543 Acute eczematoid otitis externa, bilateral: Secondary | ICD-10-CM | POA: Diagnosis not present

## 2016-07-28 DIAGNOSIS — F29 Unspecified psychosis not due to a substance or known physiological condition: Secondary | ICD-10-CM | POA: Diagnosis not present

## 2016-07-28 DIAGNOSIS — Z79899 Other long term (current) drug therapy: Secondary | ICD-10-CM | POA: Diagnosis not present

## 2016-07-28 DIAGNOSIS — F79 Unspecified intellectual disabilities: Secondary | ICD-10-CM | POA: Diagnosis not present

## 2016-07-30 DIAGNOSIS — R21 Rash and other nonspecific skin eruption: Secondary | ICD-10-CM | POA: Diagnosis not present

## 2016-08-16 DIAGNOSIS — R21 Rash and other nonspecific skin eruption: Secondary | ICD-10-CM | POA: Diagnosis not present

## 2016-08-16 DIAGNOSIS — L400 Psoriasis vulgaris: Secondary | ICD-10-CM | POA: Diagnosis not present

## 2016-08-16 DIAGNOSIS — L308 Other specified dermatitis: Secondary | ICD-10-CM | POA: Diagnosis not present

## 2016-09-08 DIAGNOSIS — L309 Dermatitis, unspecified: Secondary | ICD-10-CM | POA: Diagnosis not present

## 2016-09-08 DIAGNOSIS — L400 Psoriasis vulgaris: Secondary | ICD-10-CM | POA: Diagnosis not present

## 2016-10-09 ENCOUNTER — Emergency Department
Admission: EM | Admit: 2016-10-09 | Discharge: 2016-10-10 | Disposition: A | Discharge status: Home / Self Care | Payer: Medicare Other | Attending: Emergency Medicine | Admitting: Emergency Medicine

## 2016-10-09 ENCOUNTER — Encounter: Payer: Self-pay | Admitting: Emergency Medicine

## 2016-10-09 DIAGNOSIS — F17210 Nicotine dependence, cigarettes, uncomplicated: Secondary | ICD-10-CM | POA: Diagnosis not present

## 2016-10-09 DIAGNOSIS — F4329 Adjustment disorder with other symptoms: Secondary | ICD-10-CM

## 2016-10-09 DIAGNOSIS — I10 Essential (primary) hypertension: Secondary | ICD-10-CM | POA: Insufficient documentation

## 2016-10-09 DIAGNOSIS — F4325 Adjustment disorder with mixed disturbance of emotions and conduct: Secondary | ICD-10-CM | POA: Diagnosis not present

## 2016-10-09 DIAGNOSIS — Z79899 Other long term (current) drug therapy: Secondary | ICD-10-CM | POA: Insufficient documentation

## 2016-10-09 DIAGNOSIS — E039 Hypothyroidism, unspecified: Secondary | ICD-10-CM | POA: Insufficient documentation

## 2016-10-09 DIAGNOSIS — F209 Schizophrenia, unspecified: Secondary | ICD-10-CM | POA: Diagnosis not present

## 2016-10-09 DIAGNOSIS — F918 Other conduct disorders: Secondary | ICD-10-CM | POA: Diagnosis present

## 2016-10-09 LAB — URINE DRUG SCREEN, QUALITATIVE (ARMC ONLY)
AMPHETAMINES, UR SCREEN: NOT DETECTED
BARBITURATES, UR SCREEN: NOT DETECTED
Benzodiazepine, Ur Scrn: NOT DETECTED
COCAINE METABOLITE, UR ~~LOC~~: NOT DETECTED
Cannabinoid 50 Ng, Ur ~~LOC~~: NOT DETECTED
MDMA (ECSTASY) UR SCREEN: NOT DETECTED
METHADONE SCREEN, URINE: NOT DETECTED
OPIATE, UR SCREEN: NOT DETECTED
Phencyclidine (PCP) Ur S: NOT DETECTED
Tricyclic, Ur Screen: POSITIVE — AB

## 2016-10-09 LAB — CBC
HEMATOCRIT: 36.4 % — AB (ref 40.0–52.0)
HEMOGLOBIN: 12.4 g/dL — AB (ref 13.0–18.0)
MCH: 28.7 pg (ref 26.0–34.0)
MCHC: 34.1 g/dL (ref 32.0–36.0)
MCV: 84.2 fL (ref 80.0–100.0)
Platelets: 195 10*3/uL (ref 150–440)
RBC: 4.33 MIL/uL — AB (ref 4.40–5.90)
RDW: 13.1 % (ref 11.5–14.5)
WBC: 9.8 10*3/uL (ref 3.8–10.6)

## 2016-10-09 LAB — COMPREHENSIVE METABOLIC PANEL
ALBUMIN: 3.7 g/dL (ref 3.5–5.0)
ALK PHOS: 75 U/L (ref 38–126)
ALT: 25 U/L (ref 17–63)
AST: 24 U/L (ref 15–41)
Anion gap: 6 (ref 5–15)
BILIRUBIN TOTAL: 0.7 mg/dL (ref 0.3–1.2)
BUN: 14 mg/dL (ref 6–20)
CALCIUM: 8.9 mg/dL (ref 8.9–10.3)
CO2: 26 mmol/L (ref 22–32)
Chloride: 108 mmol/L (ref 101–111)
Creatinine, Ser: 1.12 mg/dL (ref 0.61–1.24)
GFR calc Af Amer: >60 mL/min (ref >60)
GFR calc non Af Amer: >60 mL/min (ref >60)
GLUCOSE: 132 mg/dL — AB (ref 65–99)
Potassium: 3.8 mmol/L (ref 3.5–5.1)
SODIUM: 140 mmol/L (ref 135–145)
TOTAL PROTEIN: 7.6 g/dL (ref 6.5–8.1)

## 2016-10-09 LAB — ACETAMINOPHEN LEVEL

## 2016-10-09 LAB — SALICYLATE LEVEL: Salicylate Lvl: <7 mg/dL (ref 2.8–30.0)

## 2016-10-09 LAB — ETHANOL: Alcohol, Ethyl (B): <5 mg/dL (ref <5)
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
## 2016-10-09 NOTE — ED Notes (Signed)
MD at bedside at this time.
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
## 2016-10-09 NOTE — ED Notes (Signed)
BEHAVIORAL HEALTH ROUNDING  Patient sleeping: No.  Patient alert and oriented: yes  Behavior appropriate: Yes. ; If no, describe:  Nutrition and fluids offered: Yes  Toileting and hygiene offered: Yes  Sitter present: not applicable, Q 15 min safety rounds and observation.  Law enforcement present: Yes ODS  
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
## 2016-10-09 NOTE — ED Notes (Signed)
BEHAVIORAL HEALTH ROUNDING Patient sleeping: Yes.   Patient alert and oriented: not applicable SLEEPING Behavior appropriate: Yes.  ; If no, describe: SLEEPING Nutrition and fluids offered: No SLEEPING Toileting and hygiene offered: NoSLEEPING Sitter present: not applicable, Q 15 min safety rounds and observation. Law enforcement present: Yes ODS 

## 2016-10-09 NOTE — ED Notes (Signed)

## 2016-10-09 NOTE — ED Notes (Signed)
Pt given dinner tray and a sprite per his request. Claudine at bedside to assess patient at this time. Will continue to monitor for further patient needs.

## 2016-10-09 NOTE — ED Notes (Signed)
Small abrasion note to left elbow  Dry dressing applied

## 2016-10-09 NOTE — ED Notes (Signed)
Pt requesting to go outside and smoke and for some candy. This RN explained that this is a no smoking facility and that patient is not allowed to leave the quad area, this RN also explained that we do not have any candy to give him. Pt states understanding. Lights dimmed for patient comfort. NAD noted at this time. Will continue to monitor for further patient needs.

## 2016-10-09 NOTE — ED Notes (Signed)
Socks 1 white t shirt 1 red shirt, under shorts and green capri pants collected

## 2016-10-10 NOTE — ED Notes (Signed)
Report given to [facility] MD Camera placed in room.

## 2016-10-10 NOTE — ED Notes (Signed)
BEHAVIORAL HEALTH ROUNDING  Patient sleeping: No.  Patient alert and oriented: yes  Behavior appropriate: Yes. ; If no, describe:  Nutrition and fluids offered: Yes  Toileting and hygiene offered: Yes  Sitter present: not applicable, Q 15 min safety rounds and observation.  Law enforcement present: Yes ODS  
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
## 2016-10-10 NOTE — ED Notes (Signed)
BEHAVIORAL HEALTH ROUNDING Patient sleeping: Yes.   Patient alert and oriented: not applicable SLEEPING Behavior appropriate: Yes.  ; If no, describe: SLEEPING Nutrition and fluids offered: No SLEEPING Toileting and hygiene offered: NoSLEEPING Sitter present: not applicable, Q 15 min safety rounds and observation. Law enforcement present: Yes ODS 

## 2016-10-10 NOTE — ED Provider Notes (Signed)
-----------------------------------------   1:58 AM on 10/10/2016 -----------------------------------------   Blood pressure 138/87, pulse 69, temperature 98 F (36.7 C), temperature source Oral, resp. rate 18, weight 78.5 kg (173 lb), SpO2 97 %.  Patient has been evaluated by psychiatry and cleared for discharge by Dr. Hermina Staggers. Patient's labs have been reviewed with no acute findings. Patient will be discharged at this time to group home    [place], [place], MD 10/10/16 [phone]

## 2016-10-14 DIAGNOSIS — E039 Hypothyroidism, unspecified: Secondary | ICD-10-CM | POA: Diagnosis not present

## 2016-10-14 DIAGNOSIS — F209 Schizophrenia, unspecified: Secondary | ICD-10-CM | POA: Diagnosis not present

## 2016-10-14 DIAGNOSIS — L209 Atopic dermatitis, unspecified: Secondary | ICD-10-CM | POA: Diagnosis not present

## 2016-10-28 DIAGNOSIS — F79 Unspecified intellectual disabilities: Secondary | ICD-10-CM | POA: Diagnosis not present

## 2016-10-28 DIAGNOSIS — F29 Unspecified psychosis not due to a substance or known physiological condition: Secondary | ICD-10-CM | POA: Diagnosis not present

## 2016-10-28 DIAGNOSIS — F0150 Vascular dementia without behavioral disturbance: Secondary | ICD-10-CM | POA: Diagnosis not present

## 2016-10-28 DIAGNOSIS — Z79899 Other long term (current) drug therapy: Secondary | ICD-10-CM | POA: Diagnosis not present

## 2016-11-11 DIAGNOSIS — L400 Psoriasis vulgaris: Secondary | ICD-10-CM | POA: Diagnosis not present

## 2016-11-12 DIAGNOSIS — F29 Unspecified psychosis not due to a substance or known physiological condition: Secondary | ICD-10-CM | POA: Diagnosis not present

## 2016-11-12 DIAGNOSIS — F17200 Nicotine dependence, unspecified, uncomplicated: Secondary | ICD-10-CM | POA: Diagnosis not present

## 2016-11-12 DIAGNOSIS — L404 Guttate psoriasis: Secondary | ICD-10-CM | POA: Diagnosis not present

## 2016-12-28 DIAGNOSIS — F79 Unspecified intellectual disabilities: Secondary | ICD-10-CM | POA: Diagnosis not present

## 2016-12-28 DIAGNOSIS — F29 Unspecified psychosis not due to a substance or known physiological condition: Secondary | ICD-10-CM | POA: Diagnosis not present

## 2017-01-20 DIAGNOSIS — L400 Psoriasis vulgaris: Secondary | ICD-10-CM | POA: Diagnosis not present

## 2017-01-27 DIAGNOSIS — L400 Psoriasis vulgaris: Secondary | ICD-10-CM | POA: Diagnosis not present

## 2017-01-27 DIAGNOSIS — Z79899 Other long term (current) drug therapy: Secondary | ICD-10-CM | POA: Diagnosis not present

## 2017-02-04 DIAGNOSIS — E039 Hypothyroidism, unspecified: Secondary | ICD-10-CM | POA: Diagnosis not present

## 2017-02-04 DIAGNOSIS — F209 Schizophrenia, unspecified: Secondary | ICD-10-CM | POA: Diagnosis not present

## 2017-02-04 DIAGNOSIS — D649 Anemia, unspecified: Secondary | ICD-10-CM | POA: Diagnosis not present

## 2017-02-04 DIAGNOSIS — L404 Guttate psoriasis: Secondary | ICD-10-CM | POA: Diagnosis not present

## 2017-03-02 DIAGNOSIS — L400 Psoriasis vulgaris: Secondary | ICD-10-CM | POA: Diagnosis not present

## 2017-03-02 DIAGNOSIS — Z79899 Other long term (current) drug therapy: Secondary | ICD-10-CM | POA: Diagnosis not present

## 2017-03-09 DIAGNOSIS — Z79899 Other long term (current) drug therapy: Secondary | ICD-10-CM | POA: Diagnosis not present

## 2017-03-09 DIAGNOSIS — L400 Psoriasis vulgaris: Secondary | ICD-10-CM | POA: Diagnosis not present

## 2017-03-23 DIAGNOSIS — L400 Psoriasis vulgaris: Secondary | ICD-10-CM | POA: Diagnosis not present

## 2017-03-29 DIAGNOSIS — F79 Unspecified intellectual disabilities: Secondary | ICD-10-CM | POA: Diagnosis not present

## 2017-03-29 DIAGNOSIS — F29 Unspecified psychosis not due to a substance or known physiological condition: Secondary | ICD-10-CM | POA: Diagnosis not present

## 2017-04-06 DIAGNOSIS — L400 Psoriasis vulgaris: Secondary | ICD-10-CM | POA: Diagnosis not present

## 2017-04-06 DIAGNOSIS — L819 Disorder of pigmentation, unspecified: Secondary | ICD-10-CM | POA: Diagnosis not present

## 2017-04-06 DIAGNOSIS — L4052 Psoriatic arthritis mutilans: Secondary | ICD-10-CM | POA: Diagnosis not present

## 2017-04-20 DIAGNOSIS — L400 Psoriasis vulgaris: Secondary | ICD-10-CM | POA: Diagnosis not present

## 2017-05-03 DIAGNOSIS — L4052 Psoriatic arthritis mutilans: Secondary | ICD-10-CM | POA: Diagnosis not present

## 2017-05-03 DIAGNOSIS — L400 Psoriasis vulgaris: Secondary | ICD-10-CM | POA: Diagnosis not present

## 2017-05-03 DIAGNOSIS — Z79899 Other long term (current) drug therapy: Secondary | ICD-10-CM | POA: Diagnosis not present

## 2017-05-17 DIAGNOSIS — L400 Psoriasis vulgaris: Secondary | ICD-10-CM | POA: Diagnosis not present

## 2017-05-31 DIAGNOSIS — L400 Psoriasis vulgaris: Secondary | ICD-10-CM | POA: Diagnosis not present

## 2017-06-14 DIAGNOSIS — L400 Psoriasis vulgaris: Secondary | ICD-10-CM | POA: Diagnosis not present

## 2017-06-28 DIAGNOSIS — L400 Psoriasis vulgaris: Secondary | ICD-10-CM | POA: Diagnosis not present

## 2017-06-28 DIAGNOSIS — Z79899 Other long term (current) drug therapy: Secondary | ICD-10-CM | POA: Diagnosis not present

## 2017-06-28 DIAGNOSIS — L4052 Psoriatic arthritis mutilans: Secondary | ICD-10-CM | POA: Diagnosis not present

## 2017-07-13 DIAGNOSIS — L4052 Psoriatic arthritis mutilans: Secondary | ICD-10-CM | POA: Diagnosis not present

## 2017-07-13 DIAGNOSIS — L400 Psoriasis vulgaris: Secondary | ICD-10-CM | POA: Diagnosis not present

## 2017-07-13 DIAGNOSIS — Z79899 Other long term (current) drug therapy: Secondary | ICD-10-CM | POA: Diagnosis not present

## 2017-07-25 DIAGNOSIS — Z79899 Other long term (current) drug therapy: Secondary | ICD-10-CM | POA: Diagnosis not present

## 2017-07-25 DIAGNOSIS — L400 Psoriasis vulgaris: Secondary | ICD-10-CM | POA: Diagnosis not present

## 2017-07-26 DIAGNOSIS — F29 Unspecified psychosis not due to a substance or known physiological condition: Secondary | ICD-10-CM | POA: Diagnosis not present

## 2017-07-26 DIAGNOSIS — F79 Unspecified intellectual disabilities: Secondary | ICD-10-CM | POA: Diagnosis not present

## 2017-08-09 DIAGNOSIS — L400 Psoriasis vulgaris: Secondary | ICD-10-CM | POA: Diagnosis not present

## 2017-08-31 DIAGNOSIS — L989 Disorder of the skin and subcutaneous tissue, unspecified: Secondary | ICD-10-CM | POA: Diagnosis not present

## 2017-08-31 DIAGNOSIS — L400 Psoriasis vulgaris: Secondary | ICD-10-CM | POA: Diagnosis not present

## 2017-09-14 DIAGNOSIS — Z79899 Other long term (current) drug therapy: Secondary | ICD-10-CM | POA: Diagnosis not present

## 2017-09-14 DIAGNOSIS — L400 Psoriasis vulgaris: Secondary | ICD-10-CM | POA: Diagnosis not present

## 2017-09-14 DIAGNOSIS — L4052 Psoriatic arthritis mutilans: Secondary | ICD-10-CM | POA: Diagnosis not present

## 2017-09-28 DIAGNOSIS — Z79899 Other long term (current) drug therapy: Secondary | ICD-10-CM | POA: Diagnosis not present

## 2017-09-28 DIAGNOSIS — L4052 Psoriatic arthritis mutilans: Secondary | ICD-10-CM | POA: Diagnosis not present

## 2017-09-28 DIAGNOSIS — L400 Psoriasis vulgaris: Secondary | ICD-10-CM | POA: Diagnosis not present

## 2017-10-10 IMAGING — CT CT HEAD W/O CM
3 series · 15 of 47 positions shown, 18 images · non-contrast
Comparison: None.

CLINICAL DATA: Fall with syncope. Initial encounter.

EXAM:
CT HEAD WITHOUT CONTRAST
TECHNIQUE: Contiguous axial images were obtained from the base of the skull
through the vertex without intravenous contrast.

[Series 2: head wo · axial · 0.41mm/px · z∈[-20,+104]mm · 9 of 31 slices shown, 12 images]
[im 3/31  brain]
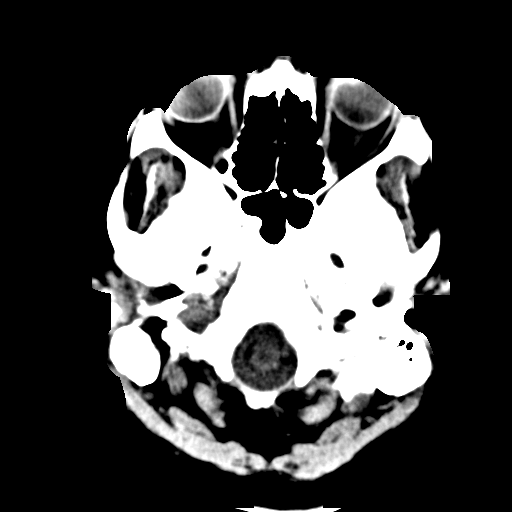
[im 3/31  bone]
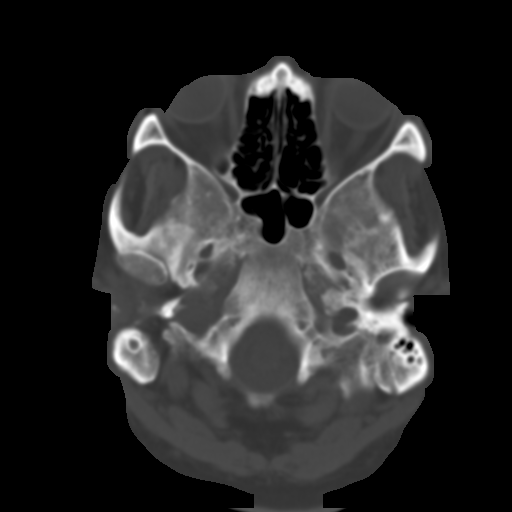
[im 6/31  brain]
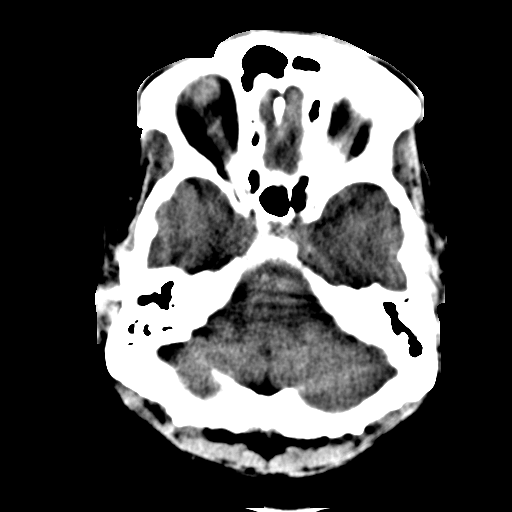
[im 9/31  brain]
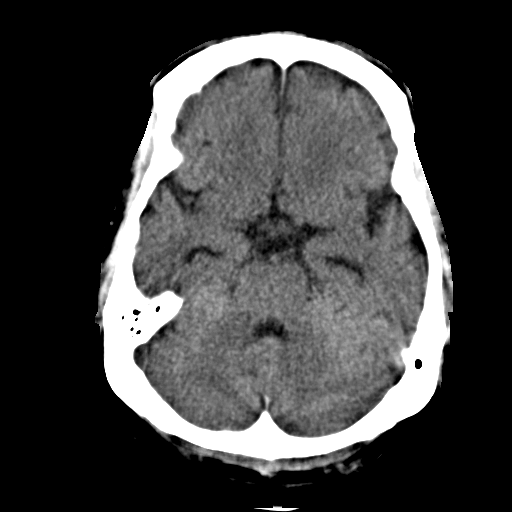
[im 12/31  brain]
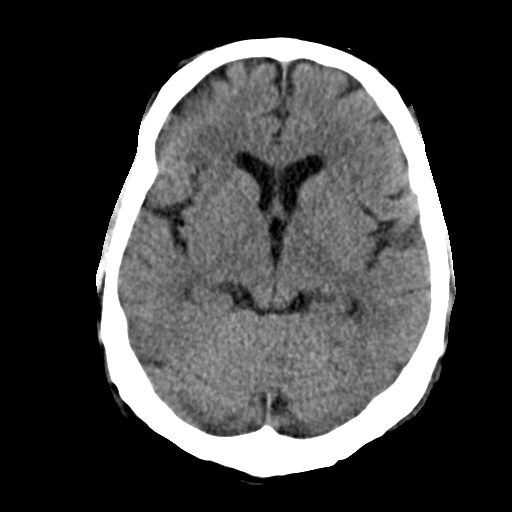
[im 16/31  brain]
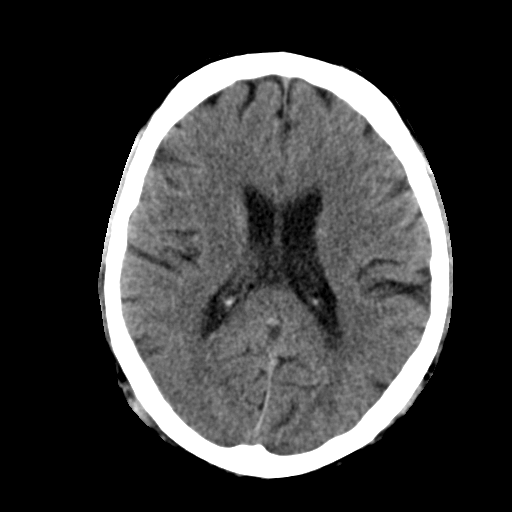
[im 16/31  bone]
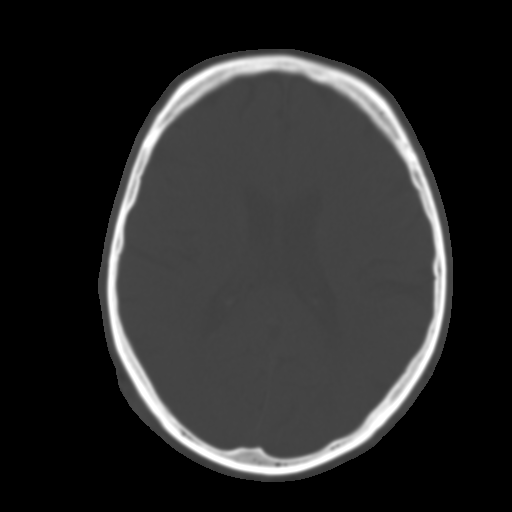
[im 19/31  brain]
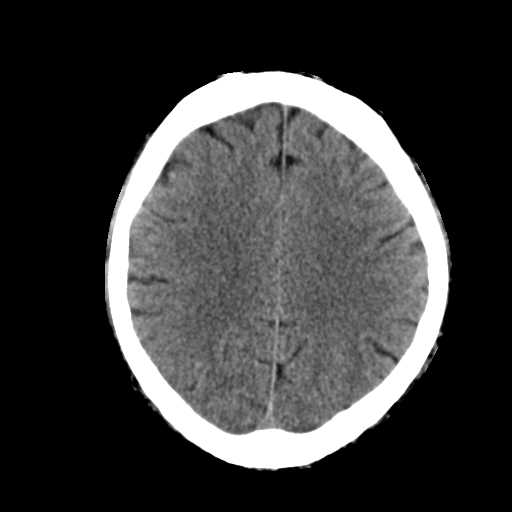
[im 22/31  brain]
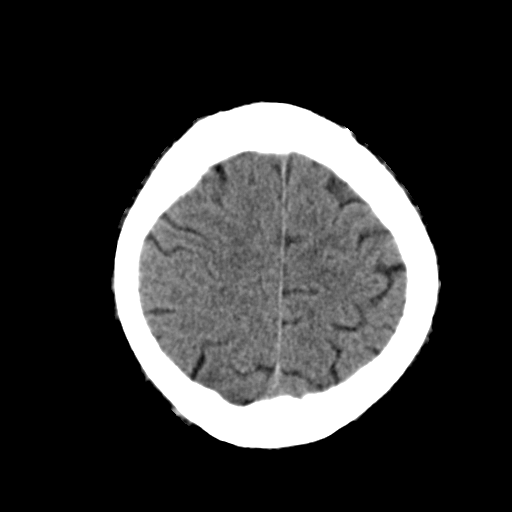
[im 25/31  brain]
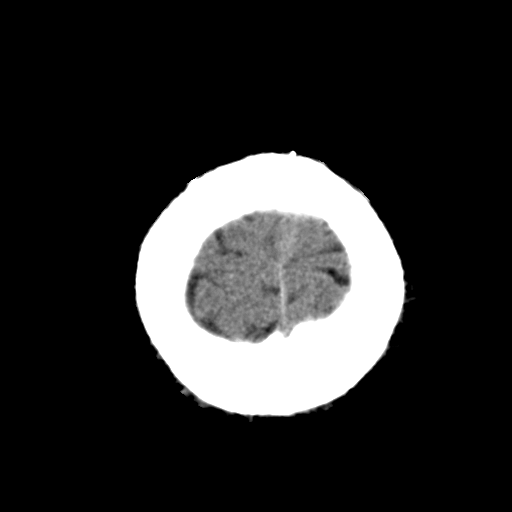
[im 28/31  brain]
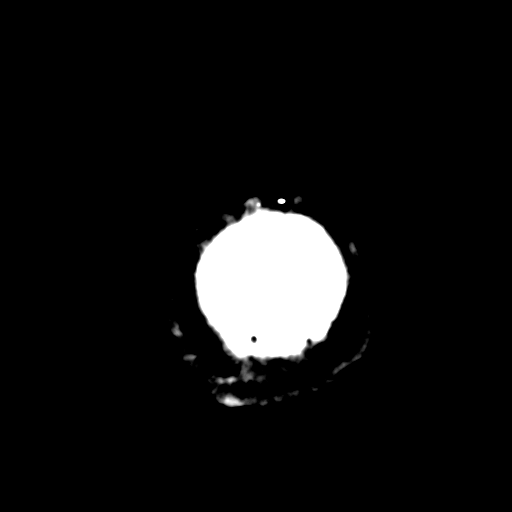
[im 28/31  bone]
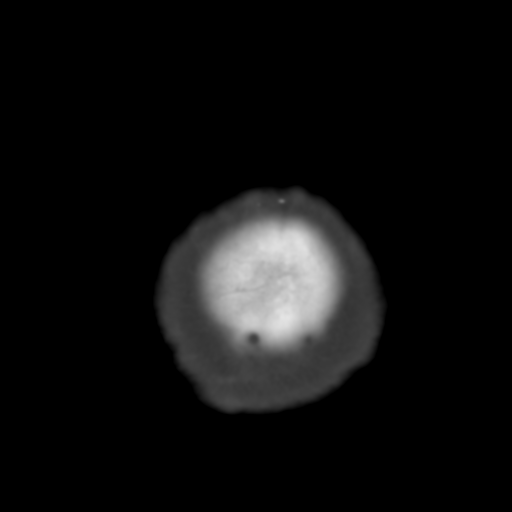

[Series 4: coronal soft tissue · coronal · 0.30mm/px · 3 of 65 slices shown]
[im 22/65  brain]
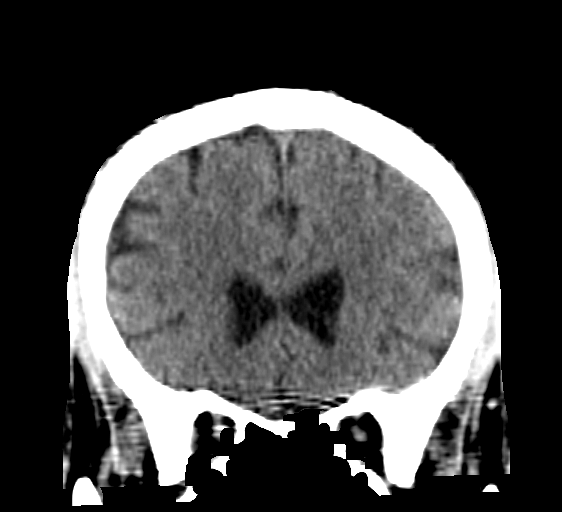
[im 29/65  brain]
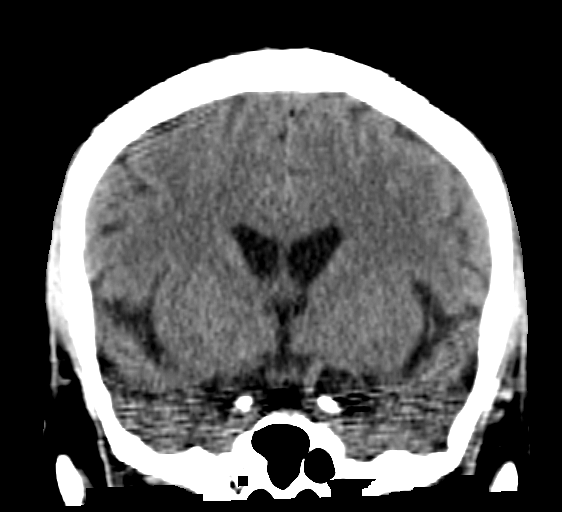
[im 36/65  brain]
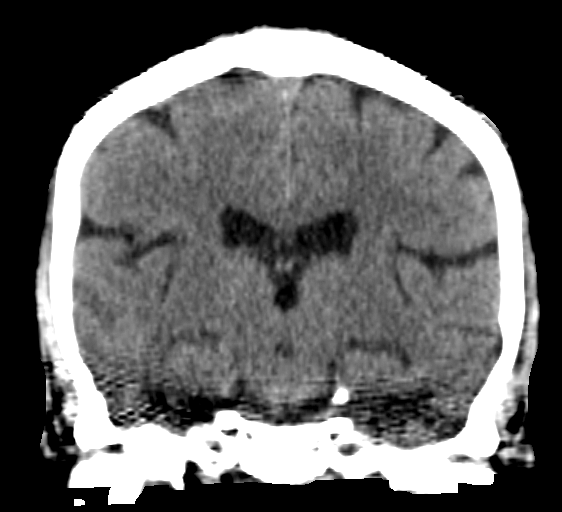

[Series 5: sagittal soft tissue · sagittal · 0.30mm/px · 3 of 56 slices shown]
[im 19/56  brain]
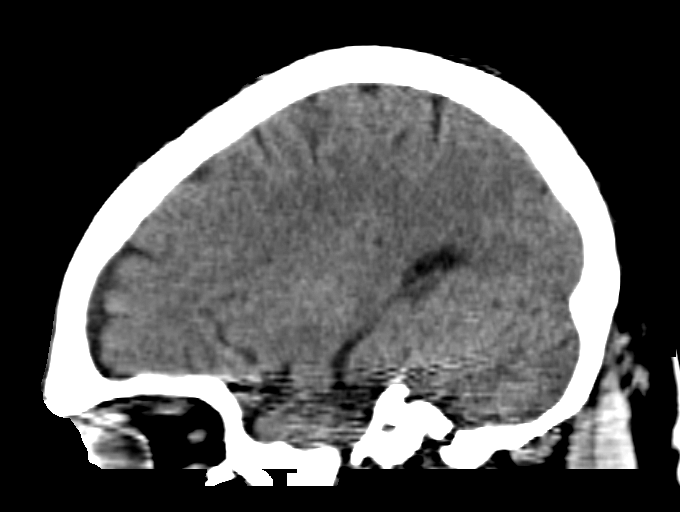
[im 28/56  brain]
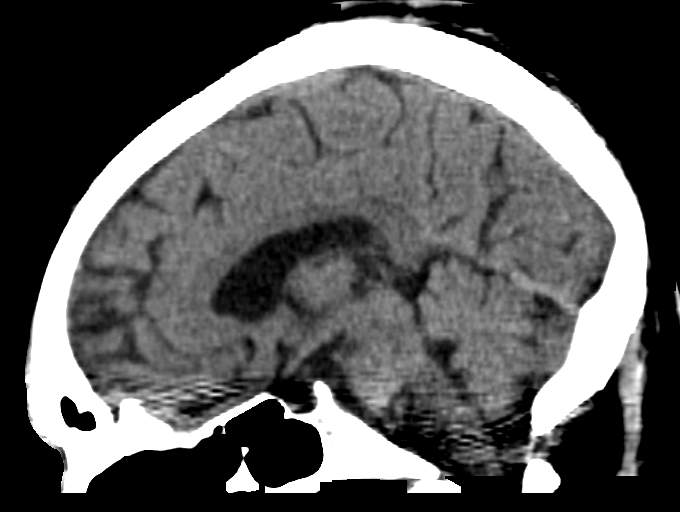
[im 37/56  brain]
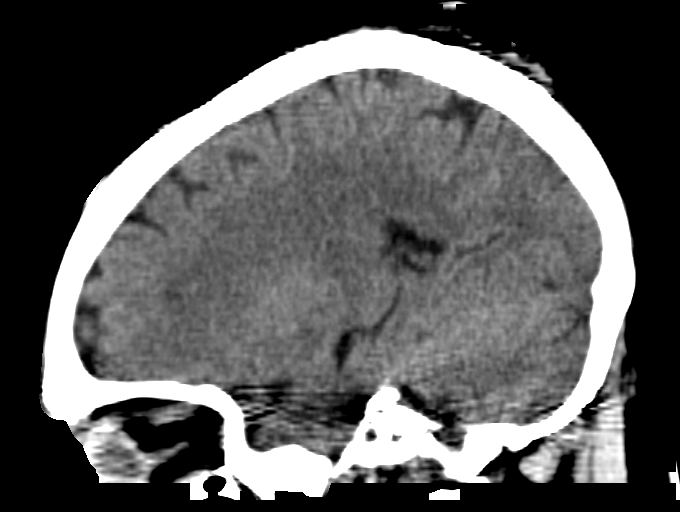

[15 of 47 positions shown; findings below may reference images not displayed]

FINDINGS: Brain: No evidence of acute infarction, hemorrhage, hydrocephalus,
or mass lesion/mass effect.

Vascular: No hyperdense vessel or unexpected calcification.

Skull: Subcutaneous reticulation on the forehead could be scarring
or contusion. Negative for fracture. Scalp calcifications without
convincing foreign body. Prominent right parietal foramen emissary
vein, incidental in this clinical setting.

Sinuses/Orbits: No acute finding.

Other: None.
IMPRESSION: Negative for intracranial injury or fracture.

## 2017-10-13 DIAGNOSIS — L400 Psoriasis vulgaris: Secondary | ICD-10-CM | POA: Diagnosis not present

## 2017-10-27 DIAGNOSIS — L400 Psoriasis vulgaris: Secondary | ICD-10-CM | POA: Diagnosis not present

## 2017-10-27 DIAGNOSIS — Z79899 Other long term (current) drug therapy: Secondary | ICD-10-CM | POA: Diagnosis not present

## 2017-10-27 DIAGNOSIS — L819 Disorder of pigmentation, unspecified: Secondary | ICD-10-CM | POA: Diagnosis not present

## 2017-10-28 DIAGNOSIS — F209 Schizophrenia, unspecified: Secondary | ICD-10-CM | POA: Diagnosis not present

## 2017-10-28 DIAGNOSIS — L404 Guttate psoriasis: Secondary | ICD-10-CM | POA: Diagnosis not present

## 2017-11-10 DIAGNOSIS — L400 Psoriasis vulgaris: Secondary | ICD-10-CM | POA: Diagnosis not present

## 2017-11-22 DIAGNOSIS — F29 Unspecified psychosis not due to a substance or known physiological condition: Secondary | ICD-10-CM | POA: Diagnosis not present

## 2017-11-22 DIAGNOSIS — F79 Unspecified intellectual disabilities: Secondary | ICD-10-CM | POA: Diagnosis not present

## 2017-11-24 DIAGNOSIS — L400 Psoriasis vulgaris: Secondary | ICD-10-CM | POA: Diagnosis not present

## 2017-12-08 DIAGNOSIS — L400 Psoriasis vulgaris: Secondary | ICD-10-CM | POA: Diagnosis not present

## 2017-12-26 DIAGNOSIS — L400 Psoriasis vulgaris: Secondary | ICD-10-CM | POA: Diagnosis not present

## 2018-01-09 DIAGNOSIS — L400 Psoriasis vulgaris: Secondary | ICD-10-CM | POA: Diagnosis not present

## 2018-01-23 DIAGNOSIS — L400 Psoriasis vulgaris: Secondary | ICD-10-CM | POA: Diagnosis not present

## 2018-02-06 DIAGNOSIS — L400 Psoriasis vulgaris: Secondary | ICD-10-CM | POA: Diagnosis not present

## 2018-02-06 DIAGNOSIS — L853 Xerosis cutis: Secondary | ICD-10-CM | POA: Diagnosis not present

## 2018-02-23 DIAGNOSIS — L400 Psoriasis vulgaris: Secondary | ICD-10-CM | POA: Diagnosis not present

## 2018-03-09 DIAGNOSIS — L400 Psoriasis vulgaris: Secondary | ICD-10-CM | POA: Diagnosis not present

## 2018-03-09 DIAGNOSIS — D1800 Hemangioma unspecified site: Secondary | ICD-10-CM | POA: Diagnosis not present

## 2018-03-21 DIAGNOSIS — D696 Thrombocytopenia, unspecified: Secondary | ICD-10-CM | POA: Diagnosis not present

## 2018-03-21 DIAGNOSIS — Z79899 Other long term (current) drug therapy: Secondary | ICD-10-CM | POA: Diagnosis not present

## 2018-03-21 DIAGNOSIS — L400 Psoriasis vulgaris: Secondary | ICD-10-CM | POA: Diagnosis not present

## 2018-03-23 DIAGNOSIS — L400 Psoriasis vulgaris: Secondary | ICD-10-CM | POA: Diagnosis not present

## 2018-04-06 DIAGNOSIS — Z79899 Other long term (current) drug therapy: Secondary | ICD-10-CM | POA: Diagnosis not present

## 2018-04-06 DIAGNOSIS — L400 Psoriasis vulgaris: Secondary | ICD-10-CM | POA: Diagnosis not present

## 2018-04-20 DIAGNOSIS — L400 Psoriasis vulgaris: Secondary | ICD-10-CM | POA: Diagnosis not present

## 2018-04-24 DIAGNOSIS — F29 Unspecified psychosis not due to a substance or known physiological condition: Secondary | ICD-10-CM | POA: Diagnosis not present

## 2018-04-24 DIAGNOSIS — F79 Unspecified intellectual disabilities: Secondary | ICD-10-CM | POA: Diagnosis not present

## 2018-05-04 DIAGNOSIS — L400 Psoriasis vulgaris: Secondary | ICD-10-CM | POA: Diagnosis not present

## 2018-05-18 DIAGNOSIS — L400 Psoriasis vulgaris: Secondary | ICD-10-CM | POA: Diagnosis not present

## 2018-05-18 DIAGNOSIS — Z79899 Other long term (current) drug therapy: Secondary | ICD-10-CM | POA: Diagnosis not present

## 2018-05-18 DIAGNOSIS — L819 Disorder of pigmentation, unspecified: Secondary | ICD-10-CM | POA: Diagnosis not present

## 2018-06-01 DIAGNOSIS — L400 Psoriasis vulgaris: Secondary | ICD-10-CM | POA: Diagnosis not present

## 2018-06-22 DIAGNOSIS — L400 Psoriasis vulgaris: Secondary | ICD-10-CM | POA: Diagnosis not present

## 2018-06-22 DIAGNOSIS — L853 Xerosis cutis: Secondary | ICD-10-CM | POA: Diagnosis not present

## 2018-07-12 DIAGNOSIS — L400 Psoriasis vulgaris: Secondary | ICD-10-CM | POA: Diagnosis not present

## 2018-07-12 DIAGNOSIS — Z79899 Other long term (current) drug therapy: Secondary | ICD-10-CM | POA: Diagnosis not present

## 2018-07-26 DIAGNOSIS — L400 Psoriasis vulgaris: Secondary | ICD-10-CM | POA: Diagnosis not present

## 2018-08-16 DIAGNOSIS — L400 Psoriasis vulgaris: Secondary | ICD-10-CM | POA: Diagnosis not present

## 2018-08-21 DIAGNOSIS — F17200 Nicotine dependence, unspecified, uncomplicated: Secondary | ICD-10-CM | POA: Diagnosis not present

## 2018-08-21 DIAGNOSIS — J45909 Unspecified asthma, uncomplicated: Secondary | ICD-10-CM | POA: Diagnosis not present

## 2018-08-21 DIAGNOSIS — F209 Schizophrenia, unspecified: Secondary | ICD-10-CM | POA: Diagnosis not present

## 2018-08-30 DIAGNOSIS — F79 Unspecified intellectual disabilities: Secondary | ICD-10-CM | POA: Diagnosis not present

## 2018-08-30 DIAGNOSIS — F29 Unspecified psychosis not due to a substance or known physiological condition: Secondary | ICD-10-CM | POA: Diagnosis not present

## 2018-08-30 DIAGNOSIS — L400 Psoriasis vulgaris: Secondary | ICD-10-CM | POA: Diagnosis not present

## 2018-09-01 DIAGNOSIS — F209 Schizophrenia, unspecified: Secondary | ICD-10-CM | POA: Diagnosis not present

## 2018-09-27 DIAGNOSIS — L400 Psoriasis vulgaris: Secondary | ICD-10-CM | POA: Diagnosis not present

## 2018-10-11 DIAGNOSIS — L400 Psoriasis vulgaris: Secondary | ICD-10-CM | POA: Diagnosis not present

## 2018-10-13 DIAGNOSIS — F0390 Unspecified dementia without behavioral disturbance: Secondary | ICD-10-CM | POA: Diagnosis not present

## 2018-10-13 DIAGNOSIS — L404 Guttate psoriasis: Secondary | ICD-10-CM | POA: Diagnosis not present

## 2018-10-13 DIAGNOSIS — F209 Schizophrenia, unspecified: Secondary | ICD-10-CM | POA: Diagnosis not present

## 2018-10-16 DIAGNOSIS — D649 Anemia, unspecified: Secondary | ICD-10-CM | POA: Diagnosis not present

## 2018-10-16 DIAGNOSIS — E039 Hypothyroidism, unspecified: Secondary | ICD-10-CM | POA: Diagnosis not present

## 2018-10-16 DIAGNOSIS — F209 Schizophrenia, unspecified: Secondary | ICD-10-CM | POA: Diagnosis not present

## 2018-10-26 DIAGNOSIS — L400 Psoriasis vulgaris: Secondary | ICD-10-CM | POA: Diagnosis not present

## 2018-11-09 DIAGNOSIS — L400 Psoriasis vulgaris: Secondary | ICD-10-CM | POA: Diagnosis not present

## 2018-11-22 DIAGNOSIS — F79 Unspecified intellectual disabilities: Secondary | ICD-10-CM | POA: Diagnosis not present

## 2018-11-22 DIAGNOSIS — F29 Unspecified psychosis not due to a substance or known physiological condition: Secondary | ICD-10-CM | POA: Diagnosis not present

## 2018-11-23 DIAGNOSIS — L400 Psoriasis vulgaris: Secondary | ICD-10-CM | POA: Diagnosis not present

## 2018-12-07 DIAGNOSIS — L400 Psoriasis vulgaris: Secondary | ICD-10-CM | POA: Diagnosis not present

## 2018-12-07 DIAGNOSIS — L853 Xerosis cutis: Secondary | ICD-10-CM | POA: Diagnosis not present

## 2018-12-07 DIAGNOSIS — L819 Disorder of pigmentation, unspecified: Secondary | ICD-10-CM | POA: Diagnosis not present

## 2019-01-08 DIAGNOSIS — L400 Psoriasis vulgaris: Secondary | ICD-10-CM | POA: Diagnosis not present

## 2019-01-12 DIAGNOSIS — Z20828 Contact with and (suspected) exposure to other viral communicable diseases: Secondary | ICD-10-CM | POA: Diagnosis not present

## 2019-01-25 DIAGNOSIS — Z79899 Other long term (current) drug therapy: Secondary | ICD-10-CM | POA: Diagnosis not present

## 2019-01-25 DIAGNOSIS — L400 Psoriasis vulgaris: Secondary | ICD-10-CM | POA: Diagnosis not present

## 2019-03-01 DIAGNOSIS — F29 Unspecified psychosis not due to a substance or known physiological condition: Secondary | ICD-10-CM | POA: Diagnosis not present

## 2019-03-01 DIAGNOSIS — F79 Unspecified intellectual disabilities: Secondary | ICD-10-CM | POA: Diagnosis not present

## 2019-03-07 DIAGNOSIS — L400 Psoriasis vulgaris: Secondary | ICD-10-CM | POA: Diagnosis not present

## 2019-03-07 DIAGNOSIS — L819 Disorder of pigmentation, unspecified: Secondary | ICD-10-CM | POA: Diagnosis not present

## 2019-03-07 DIAGNOSIS — L853 Xerosis cutis: Secondary | ICD-10-CM | POA: Diagnosis not present

## 2019-03-21 DIAGNOSIS — L400 Psoriasis vulgaris: Secondary | ICD-10-CM | POA: Diagnosis not present

## 2019-03-23 DIAGNOSIS — Z23 Encounter for immunization: Secondary | ICD-10-CM | POA: Diagnosis not present

## 2019-04-04 DIAGNOSIS — L400 Psoriasis vulgaris: Secondary | ICD-10-CM | POA: Diagnosis not present

## 2019-04-04 DIAGNOSIS — L853 Xerosis cutis: Secondary | ICD-10-CM | POA: Diagnosis not present

## 2019-04-18 DIAGNOSIS — L853 Xerosis cutis: Secondary | ICD-10-CM | POA: Diagnosis not present

## 2019-04-18 DIAGNOSIS — L400 Psoriasis vulgaris: Secondary | ICD-10-CM | POA: Diagnosis not present

## 2019-04-19 DIAGNOSIS — Z23 Encounter for immunization: Secondary | ICD-10-CM | POA: Diagnosis not present

## 2019-05-02 DIAGNOSIS — L400 Psoriasis vulgaris: Secondary | ICD-10-CM | POA: Diagnosis not present

## 2019-05-16 ENCOUNTER — Ambulatory Visit (INDEPENDENT_AMBULATORY_CARE_PROVIDER_SITE_OTHER): Payer: Medicare Other

## 2019-05-16 ENCOUNTER — Other Ambulatory Visit: Payer: Self-pay

## 2019-05-16 DIAGNOSIS — L400 Psoriasis vulgaris: Secondary | ICD-10-CM

## 2019-05-16 MED ORDER — ADALIMUMAB 40 MG/0.8ML ~~LOC~~ AJKT
40.0000 mg | AUTO-INJECTOR | SUBCUTANEOUS | Status: DC
  Administered 2019-05-16 – 2021-11-26 (×34): 40 mg via SUBCUTANEOUS

## 2019-05-16 NOTE — Progress Notes (Signed)
Humira 40mg  injected today into the R abdomin. Patient tolerated well.

## 2019-05-30 ENCOUNTER — Other Ambulatory Visit: Payer: Self-pay

## 2019-05-30 ENCOUNTER — Ambulatory Visit (INDEPENDENT_AMBULATORY_CARE_PROVIDER_SITE_OTHER): Payer: Medicare Other | Admitting: Dermatology

## 2019-05-30 DIAGNOSIS — L409 Psoriasis, unspecified: Secondary | ICD-10-CM | POA: Diagnosis not present

## 2019-05-30 NOTE — Progress Notes (Signed)
   Follow-Up Visit   Subjective  Brandon Singh is a 64 y.o. male who presents for the following: Psoriasis (2wk f/u pt on Humira). Severe psoriasis doing well on systemic Humira and tolerating well.  He presents for follow-up evaluation and injection today.  He should be due to for labs now.  The following portions of the chart were reviewed this encounter and updated as appropriate:     Review of Systems: No other skin or systemic complaints.  Objective  Well appearing patient in no apparent distress; mood and affect are within normal limits.  A focused examination was performed including trunk, arms, scalp. Relevant physical exam findings are noted in the Assessment and Plan.  Objective  trunk, extremities: Minimal macular spotty hyperpigmentation trunk, extremities, scalp clear  Assessment & Plan   Long term medication management.  Psoriasis trunk, extremities On systemic medication "biologic" with potential side effects.  Patient is tolerating well and doing very well.  Patient is due for labs monitoring on this medication. Severe, Controlled on Humira  Cont Humira 40mg /0.70ml sq injections qowk Humira 40mg /0.72ml sq injection today to L lower abdomen.  [id number] exp 07/23/20  Other Related Procedures Comprehensive metabolic panel CBC with Differential/Platelet QuantiFERON-TB Gold Plus  Return in about 2 weeks (around 06/13/2019) for with Nurse for Humira injection.   I, Othelia Pulling, RMA, am acting as scribe for Sarina Ser, MD .

## 2019-05-31 ENCOUNTER — Encounter: Payer: Self-pay | Admitting: Dermatology

## 2019-06-08 DIAGNOSIS — L404 Guttate psoriasis: Secondary | ICD-10-CM | POA: Diagnosis not present

## 2019-06-08 DIAGNOSIS — F17200 Nicotine dependence, unspecified, uncomplicated: Secondary | ICD-10-CM | POA: Diagnosis not present

## 2019-06-08 DIAGNOSIS — F29 Unspecified psychosis not due to a substance or known physiological condition: Secondary | ICD-10-CM | POA: Diagnosis not present

## 2019-06-13 ENCOUNTER — Ambulatory Visit: Payer: Medicare Other

## 2019-06-13 DIAGNOSIS — L409 Psoriasis, unspecified: Secondary | ICD-10-CM | POA: Diagnosis not present

## 2019-06-14 ENCOUNTER — Ambulatory Visit (INDEPENDENT_AMBULATORY_CARE_PROVIDER_SITE_OTHER): Payer: Medicare Other

## 2019-06-14 ENCOUNTER — Other Ambulatory Visit: Payer: Self-pay

## 2019-06-14 DIAGNOSIS — L400 Psoriasis vulgaris: Secondary | ICD-10-CM

## 2019-06-14 MED ORDER — ADALIMUMAB 40 MG/0.8ML ~~LOC~~ AJKT
40.0000 mg | AUTO-INJECTOR | Freq: Once | SUBCUTANEOUS | Status: AC
  Administered 2019-06-14: 40 mg via SUBCUTANEOUS

## 2019-06-14 NOTE — Progress Notes (Signed)
Humira 40mg /0.62mL injected into the right lower abdomen. Patient tolerated well.  [id number]

## 2019-06-16 LAB — COMPREHENSIVE METABOLIC PANEL
ALT: 16 IU/L (ref 0–44)
AST: 18 IU/L (ref 0–40)
Albumin/Globulin Ratio: 1.3 (ref 1.2–2.2)
Albumin: 4.1 g/dL (ref 3.8–4.8)
Alkaline Phosphatase: 81 IU/L (ref 39–117)
BUN/Creatinine Ratio: 10 (ref 10–24)
BUN: 13 mg/dL (ref 8–27)
Bilirubin Total: 0.2 mg/dL (ref 0.0–1.2)
CO2: 21 mmol/L (ref 20–29)
Calcium: 8.9 mg/dL (ref 8.6–10.2)
Chloride: 104 mmol/L (ref 96–106)
Creatinine, Ser: 1.31 mg/dL — ABNORMAL HIGH (ref 0.76–1.27)
GFR calc Af Amer: 66 mL/min/{1.73_m2} (ref >59)
GFR calc non Af Amer: 57 mL/min/{1.73_m2} — ABNORMAL LOW (ref >59)
Globulin, Total: 3.1 g/dL (ref 1.5–4.5)
Glucose: 101 mg/dL — ABNORMAL HIGH (ref 65–99)
Potassium: 4.3 mmol/L (ref 3.5–5.2)
Sodium: 140 mmol/L (ref 134–144)
Total Protein: 7.2 g/dL (ref 6.0–8.5)

## 2019-06-16 LAB — CBC WITH DIFFERENTIAL/PLATELET
Basophils Absolute: 0.1 10*3/uL (ref 0.0–0.2)
Basos: 1 %
EOS (ABSOLUTE): 0.2 10*3/uL (ref 0.0–0.4)
Eos: 2 %
Hematocrit: 39.7 % (ref 37.5–51.0)
Hemoglobin: 13.1 g/dL (ref 13.0–17.7)
Immature Grans (Abs): 0 10*3/uL (ref 0.0–0.1)
Immature Granulocytes: 0 %
Lymphocytes Absolute: 4 10*3/uL — ABNORMAL HIGH (ref 0.7–3.1)
Lymphs: 44 %
MCH: 29 pg (ref 26.6–33.0)
MCHC: 33 g/dL (ref 31.5–35.7)
MCV: 88 fL (ref 79–97)
Monocytes Absolute: 0.7 10*3/uL (ref 0.1–0.9)
Monocytes: 8 %
Neutrophils Absolute: 4.2 10*3/uL (ref 1.4–7.0)
Neutrophils: 45 %
Platelets: 147 10*3/uL — ABNORMAL LOW (ref 150–450)
RBC: 4.52 x10E6/uL (ref 4.14–5.80)
RDW: 11.5 % — ABNORMAL LOW (ref 11.6–15.4)
WBC: 9.2 10*3/uL (ref 3.4–10.8)

## 2019-06-16 LAB — QUANTIFERON-TB GOLD PLUS
QuantiFERON Mitogen Value: >10 IU/mL
QuantiFERON Nil Value: 0.03 IU/mL
QuantiFERON TB1 Ag Value: 0.07 IU/mL
QuantiFERON TB2 Ag Value: 0.09 IU/mL
QuantiFERON-TB Gold Plus: NEGATIVE

## 2019-06-18 ENCOUNTER — Telehealth: Payer: Self-pay

## 2019-06-18 DIAGNOSIS — L409 Psoriasis, unspecified: Secondary | ICD-10-CM

## 2019-06-18 MED ORDER — HUMIRA (2 PEN) 40 MG/0.4ML ~~LOC~~ AJKT: 40.0000 mg | AUTO-INJECTOR | SUBCUTANEOUS | 5 refills | Status: DC

## 2019-06-18 NOTE — Telephone Encounter (Signed)
Patient's care giver Marcello Moores informed of lab results.

## 2019-06-28 ENCOUNTER — Ambulatory Visit (INDEPENDENT_AMBULATORY_CARE_PROVIDER_SITE_OTHER): Payer: Medicare Other | Admitting: Dermatology

## 2019-06-28 DIAGNOSIS — L853 Xerosis cutis: Secondary | ICD-10-CM

## 2019-06-28 DIAGNOSIS — L4050 Arthropathic psoriasis, unspecified: Secondary | ICD-10-CM

## 2019-06-28 DIAGNOSIS — L409 Psoriasis, unspecified: Secondary | ICD-10-CM

## 2019-06-28 MED ORDER — ADALIMUMAB 40 MG/0.4ML ~~LOC~~ AJKT
40.0000 mg | AUTO-INJECTOR | Freq: Once | SUBCUTANEOUS | Status: AC
  Administered 2019-06-28: 40 mg via SUBCUTANEOUS

## 2019-06-28 NOTE — Progress Notes (Signed)
   Follow-Up Visit   Subjective  Brandon Singh is a 64 y.o. male who presents for the following: Psoriasis (2 week follow up - Humira injection. No problems.).  Patient has severe psoriasis and psoriatic arthritis doing well on current regimen of Humira.  He is not having to use topical medications.  The discoloration of the skin has cleared almost completely.  He is having no side effects from the treatment.  The following portions of the chart were reviewed this encounter and updated as appropriate:  Tobacco  Allergies  Meds  Problems  Med Hx  Surg Hx  Fam Hx      Review of Systems:  No other skin or systemic complaints except as noted in HPI or Assessment and Plan.  Objective  Well appearing patient in no apparent distress; mood and affect are within normal limits.  A focused examination was performed including trunk and extremites. Relevant physical exam findings are noted in the Assessment and Plan.  Objective  trunk, extremities: Mild macular hyperpigmented patches. No active areas of psoriasis.  Objective  trunk, extremities: Xerosis   Assessment & Plan  Psoriasis with psoriatic arthritis BSA 2% trunk, extremities  Well controlled. Continue Humira injections q2weeks.  Reviewed labs from 06/16/2019 - all WNL.  Other Related Medications Adalimumab (HUMIRA PEN) 40 MG/0.4ML PNKT  Xerosis cutis trunk, extremities  Continue moisturizer daily.  Return in about 2 weeks (around 07/12/2019) for with Estill Bamberg.  I, Ashok Cordia, CMA, am acting as scribe for Sarina Ser, MD .  Documentation: I have reviewed the above documentation for accuracy and completeness, and I agree with the above.  Sarina Ser, MD

## 2019-06-29 ENCOUNTER — Encounter: Payer: Self-pay | Admitting: Dermatology

## 2019-07-12 ENCOUNTER — Other Ambulatory Visit: Payer: Self-pay

## 2019-07-12 ENCOUNTER — Ambulatory Visit (INDEPENDENT_AMBULATORY_CARE_PROVIDER_SITE_OTHER): Payer: Medicare Other

## 2019-07-12 DIAGNOSIS — L400 Psoriasis vulgaris: Secondary | ICD-10-CM | POA: Diagnosis not present

## 2019-07-12 NOTE — Progress Notes (Signed)
Patient here today for Humira injection.  Humira 40mg /0.40mL injected SQ into the R lower abdomen.  Patient tolerated well.  Lot: [id number] Exp: 07/2020

## 2019-07-12 NOTE — Patient Instructions (Signed)
Adalimumab Injection What is this medicine? ADALIMUMAB (a dal AYE mu mab) is used to treat rheumatoid and psoriatic arthritis. It is also used to treat ankylosing spondylitis, Crohn's disease, ulcerative colitis, plaque psoriasis, hidradenitis suppurativa, and uveitis. This medicine may be used for other purposes; ask your health care provider or pharmacist if you have questions. COMMON BRAND NAME(S): CYLTEZO, Humira What should I tell my health care provider before I take this medicine? They need to know if you have any of these conditions:  diabetes  heart disease  hepatitis B or history of hepatitis B infection  immune system problems  infection or history of infections  multiple sclerosis  recently received or scheduled to receive a vaccine  scheduled to have surgery  tuberculosis, a positive skin test for tuberculosis or have recently been in close contact with someone who has tuberculosis  an unusual reaction to adalimumab, other medicines, mannitol, latex, rubber, foods, dyes, or preservatives  pregnant or trying to get pregnant  breast-feeding How should I use this medicine? This medicine is for injection under the skin. You will be taught how to prepare and give this medicine. Use exactly as directed. Take your medicine at regular intervals. Do not take your medicine more often than directed. A special MedGuide will be given to you by the pharmacist with each prescription and refill. Be sure to read this information carefully each time. It is important that you put your used needles and syringes in a special sharps container. Do not put them in a trash can. If you do not have a sharps container, call your pharmacist or healthcare provider to get one. Talk to your pediatrician regarding the use of this medicine in children. While this drug may be prescribed for children as young as 2 years for selected conditions, precautions do apply. The manufacturer of the medicine  offers free information to patients and their health care partners. Call 1-800-448-6472 for more information. Overdosage: If you think you have taken too much of this medicine contact a poison control center or emergency room at once. NOTE: This medicine is only for you. Do not share this medicine with others. What if I miss a dose? If you miss a dose, take it as soon as you can. If it is almost time for your next dose, take only that dose. Do not take double or extra doses. Give the next dose when your next scheduled dose is due. Call your doctor or health care professional if you are not sure how to handle a missed dose. What may interact with this medicine? Do not take this medicine with any of the following medications:  abatacept  anakinra  etanercept  infliximab  live virus vaccines  rilonacept This medicine may also interact with the following medications:  vaccines This list may not describe all possible interactions. Give your health care provider a list of all the medicines, herbs, non-prescription drugs, or dietary supplements you use. Also tell them if you smoke, drink alcohol, or use illegal drugs. Some items may interact with your medicine. What should I watch for while using this medicine? Visit your doctor or health care professional for regular checks on your progress. Tell your doctor or healthcare professional if your symptoms do not start to get better or if they get worse. You will be tested for tuberculosis (TB) before you start this medicine. If your doctor prescribes any medicine for TB, you should start taking the TB medicine before starting this medicine. Make sure to   finish the full course of TB medicine. Call your doctor or health care professional if you get a cold or other infection while receiving this medicine. Do not treat yourself. This medicine may decrease your body's ability to fight infection. Talk to your doctor about your risk of cancer. You may be  more at risk for certain types of cancers if you take this medicine. What side effects may I notice from receiving this medicine? Side effects that you should report to your doctor or health care professional as soon as possible:  allergic reactions like skin rash, itching or hives, swelling of the face, lips, or tongue  breathing problems  changes in vision  chest pain  fever, chills, or any other sign of infection  numbness or tingling  red, scaly patches or raised bumps on the skin  swelling of the ankles  swollen lymph nodes in the neck, underarm, or groin areas  unexplained weight loss  unusual bleeding or bruising  unusually weak or tired Side effects that usually do not require medical attention (report to your doctor or health care professional if they continue or are bothersome):  headache  nausea  redness, itching, swelling, or bruising at site where injected This list may not describe all possible side effects. Call your doctor for medical advice about side effects. You may report side effects to FDA at 1-800-FDA-1088. Where should I keep my medicine? Keep out of the reach of children. Store in the original container and in the refrigerator between 2 and 8 degrees C (36 and 46 degrees F). Do not freeze. The product may be stored in a cool carrier with an ice pack, if needed. Protect from light. Throw away any unused medicine after the expiration date. NOTE: This sheet is a summary. It may not cover all possible information. If you have questions about this medicine, talk to your doctor, pharmacist, or health care provider.  2020 Elsevier/Gold Standard (2017-11-28 13:22:46)  

## 2019-07-19 DIAGNOSIS — F29 Unspecified psychosis not due to a substance or known physiological condition: Secondary | ICD-10-CM | POA: Diagnosis not present

## 2019-07-19 DIAGNOSIS — F79 Unspecified intellectual disabilities: Secondary | ICD-10-CM | POA: Diagnosis not present

## 2019-07-26 ENCOUNTER — Ambulatory Visit (INDEPENDENT_AMBULATORY_CARE_PROVIDER_SITE_OTHER): Payer: Medicare Other

## 2019-07-26 ENCOUNTER — Other Ambulatory Visit: Payer: Self-pay

## 2019-07-26 DIAGNOSIS — L400 Psoriasis vulgaris: Secondary | ICD-10-CM

## 2019-07-26 NOTE — Progress Notes (Signed)
Patient here today for 2 week Humira injection.   Humira injected into the left lower abdomen. Patient tolerated well.  LOT: [id number] EXP: 11/2020

## 2019-08-16 ENCOUNTER — Other Ambulatory Visit: Payer: Self-pay

## 2019-08-16 ENCOUNTER — Ambulatory Visit (INDEPENDENT_AMBULATORY_CARE_PROVIDER_SITE_OTHER): Payer: Medicare Other | Admitting: Dermatology

## 2019-08-16 DIAGNOSIS — L409 Psoriasis, unspecified: Secondary | ICD-10-CM

## 2019-08-16 NOTE — Progress Notes (Signed)
   Follow-Up Visit   Subjective  Brandon Singh is a 64 y.o. male who presents for the following: Psoriasis (1  month f/u Psoriasis, treating with Humira injection qowk with a good response ). He presents for follow-up evaluation and injection.  Care giver with pt who gives history.  The following portions of the chart were reviewed this encounter and updated as appropriate:  Tobacco  Allergies  Meds  Problems  Med Hx  Surg Hx  Fam Hx      Review of Systems:  No other skin or systemic complaints except as noted in HPI or Assessment and Plan.  Objective  Well appearing patient in no apparent distress; mood and affect are within normal limits.  A focused examination was performed including face, scalp, exts, trunk . Relevant physical exam findings are noted in the Assessment and Plan.  Objective  Left Upper Arm - Anterior: Macular hyperpigmentation at exts, trunk,  Scalp clear     Assessment & Plan    Psoriasis, Severe - much better controlled and mostly clear on Humira shots.  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 Left Upper Arm - Anterior  On systemic medication "biologic" with potential side effects.  Patient is tolerating well and doing very well.  Severe, Controlled on Humira  Cont Humira 40mg /0.40ml sq injections qowk Humira 40mg /0.75ml sq injection today to R lower abdomen.  [id number] exp 11/22/20  Other Related Medications Adalimumab (HUMIRA PEN) 40 MG/0.4ML PNKT  Return in about 2 weeks (around 08/30/2019) for nurse . IMarye Round, CMA, am acting as scribe for Sarina Ser, MD .  Documentation: I have reviewed the above documentation for accuracy and completeness, and I agree with the above.  Sarina Ser, MD

## 2019-08-22 ENCOUNTER — Encounter: Payer: Self-pay | Admitting: Dermatology

## 2019-08-30 ENCOUNTER — Ambulatory Visit (INDEPENDENT_AMBULATORY_CARE_PROVIDER_SITE_OTHER): Payer: Medicare Other

## 2019-08-30 ENCOUNTER — Other Ambulatory Visit: Payer: Self-pay

## 2019-08-30 DIAGNOSIS — L400 Psoriasis vulgaris: Secondary | ICD-10-CM | POA: Diagnosis not present

## 2019-08-30 NOTE — Progress Notes (Signed)
Patient here today for 2 week Humira injection.  Humira injected into left lower abdomen. Patient tolerated well.  LOT: [id number] EXP: 09/2020

## 2019-08-31 DIAGNOSIS — L404 Guttate psoriasis: Secondary | ICD-10-CM | POA: Diagnosis not present

## 2019-08-31 DIAGNOSIS — D649 Anemia, unspecified: Secondary | ICD-10-CM | POA: Diagnosis not present

## 2019-08-31 DIAGNOSIS — F209 Schizophrenia, unspecified: Secondary | ICD-10-CM | POA: Diagnosis not present

## 2019-08-31 DIAGNOSIS — F17200 Nicotine dependence, unspecified, uncomplicated: Secondary | ICD-10-CM | POA: Diagnosis not present

## 2019-08-31 DIAGNOSIS — E039 Hypothyroidism, unspecified: Secondary | ICD-10-CM | POA: Diagnosis not present

## 2019-09-13 ENCOUNTER — Other Ambulatory Visit: Payer: Self-pay

## 2019-09-13 ENCOUNTER — Ambulatory Visit (INDEPENDENT_AMBULATORY_CARE_PROVIDER_SITE_OTHER): Payer: Medicare Other | Admitting: Dermatology

## 2019-09-13 DIAGNOSIS — L409 Psoriasis, unspecified: Secondary | ICD-10-CM | POA: Diagnosis not present

## 2019-09-13 MED ORDER — ADALIMUMAB 40 MG/0.4ML ~~LOC~~ AJKT
40.0000 mg | AUTO-INJECTOR | Freq: Once | SUBCUTANEOUS | Status: AC
  Administered 2019-09-13: 40 mg via SUBCUTANEOUS

## 2019-09-13 NOTE — Progress Notes (Signed)
   Follow-Up Visit   Subjective  Brandon Singh is a 64 y.o. male who presents for the following: Psoriasis. Patient here today for 2 week psoriasis follow up and Humira injection.  The patient is unable to inject himself and his caretakers are unable to inject him either.  He and his caretakers prefer to come to the office for his Humira injections.  We had have offered several times over his treatment course to educate them to do the injections at his home, but they are unwilling and unable to do so.  The following portions of the chart were reviewed this encounter and updated as appropriate:  Tobacco  Allergies  Meds  Problems  Med Hx  Surg Hx  Fam Hx     Review of Systems:  No other skin or systemic complaints except as noted in HPI or Assessment and Plan.  Objective  Well appearing patient in no apparent distress; mood and affect are within normal limits.  All skin waist up examined.  Objective  Trunk: Mild spotty hyperpigmentation trunk, face is clear   Assessment & Plan    Psoriasis, severe on systemic Humira shots with significant improvement.  Only macular hyperpigmentation remains.  The patient and his caretakers are very pleased with his excellent response to treatment. He has decreased from over 50% BSA psoriasis involvement to approximately 5% BSA with dyschromia. Trunk  Cont Humira 40mg /0.26mL sq q2wks  Adalimumab (HUMIRA PEN) 40 MG/0.4ML PNKT - Trunk  Return in about 2 weeks (around 09/27/2019).  Graciella Belton, RMA, am acting as scribe for Sarina Ser, MD .  Documentation: I have reviewed the above documentation for accuracy and completeness, and I agree with the above.  Sarina Ser, MD

## 2019-09-16 ENCOUNTER — Encounter: Payer: Self-pay | Admitting: Dermatology

## 2019-09-27 ENCOUNTER — Other Ambulatory Visit: Payer: Self-pay

## 2019-09-27 ENCOUNTER — Ambulatory Visit (INDEPENDENT_AMBULATORY_CARE_PROVIDER_SITE_OTHER): Payer: Medicare Other

## 2019-09-27 DIAGNOSIS — L400 Psoriasis vulgaris: Secondary | ICD-10-CM

## 2019-09-27 NOTE — Progress Notes (Signed)
Patient here for two week Humira injection.  Humira 40mg /0.66mL injected into left lower abdomen. Patient tolerated well.  LOT: [id number] EXP: [id number]

## 2019-10-11 ENCOUNTER — Ambulatory Visit (INDEPENDENT_AMBULATORY_CARE_PROVIDER_SITE_OTHER): Payer: Medicare Other | Admitting: Dermatology

## 2019-10-11 ENCOUNTER — Other Ambulatory Visit: Payer: Self-pay

## 2019-10-11 DIAGNOSIS — L409 Psoriasis, unspecified: Secondary | ICD-10-CM | POA: Diagnosis not present

## 2019-10-11 NOTE — Progress Notes (Signed)
° °  Follow-Up Visit   Subjective  Brandon Singh is a 64 y.o. male who presents for the following: Psoriasis (2 weeks f/u Psoriasis, treating with Humira 40 mg injection every 2 weeks with a good response ).  Caretaker with pt and contributes to history.  The following portions of the chart were reviewed this encounter and updated as appropriate:  Tobacco   Allergies   Meds   Problems   Med Hx   Surg Hx   Fam Hx      Review of Systems:  No other skin or systemic complaints except as noted in HPI or Assessment and Plan.  Objective  Well appearing patient in no apparent distress; mood and affect are within normal limits.  All skin waist up examined.  And legs  Objective  face,trunk,exts: macular hyperpigmentation      Assessment & Plan  Psoriasis face,trunk,exts     Psoriasis, severe on systemic Humira shots with significant improvement.  Only macular hyperpigmentation remains.  The patient and his caretakers are very pleased with his excellent response to treatment. He has decreased from over 50% BSA psoriasis involvement to approximately 5% BSA with dyschromia.  Cont Humira 40mg /0.67mL sq injected at the R abdomen      Other Related Medications Adalimumab (HUMIRA PEN) 40 MG/0.4ML PNKT  Return in about 3 months (around 01/11/2020) for Psoriasis, every 2 weeks with the nurse for Humira injections.  IMarye Round, CMA, am acting as scribe for Sarina Ser, MD .  Documentation: I have reviewed the above documentation for accuracy and completeness, and I agree with the above.  Sarina Ser, MD

## 2019-10-17 ENCOUNTER — Encounter: Payer: Self-pay | Admitting: Dermatology

## 2019-10-25 ENCOUNTER — Other Ambulatory Visit: Payer: Self-pay

## 2019-10-25 ENCOUNTER — Ambulatory Visit (INDEPENDENT_AMBULATORY_CARE_PROVIDER_SITE_OTHER): Payer: Medicare Other

## 2019-10-25 DIAGNOSIS — L400 Psoriasis vulgaris: Secondary | ICD-10-CM | POA: Diagnosis not present

## 2019-10-25 NOTE — Progress Notes (Signed)
Patient here for 2 week Humira injection.   Humira 40mg  Pen injected SQ to left lower abdomen. Patient tolerated well.  LOT: [id number] EXP: 12/2020

## 2019-11-08 ENCOUNTER — Ambulatory Visit (INDEPENDENT_AMBULATORY_CARE_PROVIDER_SITE_OTHER): Payer: Medicare Other

## 2019-11-08 ENCOUNTER — Other Ambulatory Visit: Payer: Self-pay

## 2019-11-08 DIAGNOSIS — F29 Unspecified psychosis not due to a substance or known physiological condition: Secondary | ICD-10-CM | POA: Diagnosis not present

## 2019-11-08 DIAGNOSIS — F79 Unspecified intellectual disabilities: Secondary | ICD-10-CM | POA: Diagnosis not present

## 2019-11-08 DIAGNOSIS — L400 Psoriasis vulgaris: Secondary | ICD-10-CM | POA: Diagnosis not present

## 2019-11-08 NOTE — Progress Notes (Signed)
Patient here for two week Humira injection.   Humira 40mg /0.49mL injected SQ into right lower abdomen. Patient tolerated well.   LOT: [id number] EXP: [id number]

## 2019-11-22 ENCOUNTER — Ambulatory Visit (INDEPENDENT_AMBULATORY_CARE_PROVIDER_SITE_OTHER): Payer: Medicare Other

## 2019-11-22 ENCOUNTER — Other Ambulatory Visit: Payer: Self-pay

## 2019-11-22 DIAGNOSIS — L409 Psoriasis, unspecified: Secondary | ICD-10-CM | POA: Diagnosis not present

## 2019-11-22 NOTE — Progress Notes (Signed)
Patient here for two week Humira injection.   Humira 40mg /0.95mL injected SQ into left lower abdomen. Patient tolerated well.   LOT: [id number] EXP: 02/2021

## 2019-12-03 ENCOUNTER — Other Ambulatory Visit: Payer: Self-pay

## 2019-12-03 DIAGNOSIS — L409 Psoriasis, unspecified: Secondary | ICD-10-CM

## 2019-12-03 MED ORDER — HUMIRA (2 PEN) 40 MG/0.4ML ~~LOC~~ AJKT: 40.0000 mg | AUTO-INJECTOR | SUBCUTANEOUS | 5 refills | Status: DC

## 2019-12-03 NOTE — Progress Notes (Signed)
Rx refill request from pharmacy

## 2019-12-05 ENCOUNTER — Ambulatory Visit (INDEPENDENT_AMBULATORY_CARE_PROVIDER_SITE_OTHER): Payer: Medicare Other

## 2019-12-05 ENCOUNTER — Other Ambulatory Visit: Payer: Self-pay

## 2019-12-05 DIAGNOSIS — L409 Psoriasis, unspecified: Secondary | ICD-10-CM

## 2019-12-05 NOTE — Progress Notes (Signed)
Patient here for two week Humira injection.    Humira 40mg /0.19mL injected SQ into right lower abdomen. Patient tolerated well.    LOT: [id number] EXP: 12/2020

## 2019-12-19 ENCOUNTER — Ambulatory Visit (INDEPENDENT_AMBULATORY_CARE_PROVIDER_SITE_OTHER): Payer: Medicare Other

## 2019-12-19 ENCOUNTER — Other Ambulatory Visit: Payer: Self-pay

## 2019-12-19 DIAGNOSIS — L400 Psoriasis vulgaris: Secondary | ICD-10-CM

## 2019-12-19 NOTE — Progress Notes (Signed)
Patient here for two week Humira injection.   Humira 40mg  injected into left lower abdomen. Patient tolerated well.  LOT: [id number] EXP: 02/22/2021

## 2020-01-03 ENCOUNTER — Ambulatory Visit: Payer: Medicare Other | Admitting: Dermatology

## 2020-01-03 ENCOUNTER — Ambulatory Visit: Payer: Medicare Other

## 2020-01-07 ENCOUNTER — Other Ambulatory Visit: Payer: Self-pay

## 2020-01-07 ENCOUNTER — Ambulatory Visit (INDEPENDENT_AMBULATORY_CARE_PROVIDER_SITE_OTHER): Payer: Medicare Other | Admitting: Dermatology

## 2020-01-07 DIAGNOSIS — D489 Neoplasm of uncertain behavior, unspecified: Secondary | ICD-10-CM

## 2020-01-07 DIAGNOSIS — B079 Viral wart, unspecified: Secondary | ICD-10-CM

## 2020-01-07 DIAGNOSIS — L409 Psoriasis, unspecified: Secondary | ICD-10-CM

## 2020-01-07 MED ORDER — ADALIMUMAB 40 MG/0.4ML ~~LOC~~ AJKT
40.0000 mg | AUTO-INJECTOR | Freq: Once | SUBCUTANEOUS | Status: AC
  Administered 2020-01-07: 40 mg via SUBCUTANEOUS

## 2020-01-07 NOTE — Patient Instructions (Signed)

## 2020-01-07 NOTE — Progress Notes (Signed)
   Follow-Up Visit   Subjective  Brandon Singh is a 64 y.o. male who presents for the following: Follow-up (Patient here for 2 week psoriasis follow up and Humira injection. ). The patient continues to do well on Humira injections for his severe generalized psoriasis.  He is improved and remains almost clear, is happy with his treatment and wants to continue.  He nor the care home staff where he stays are comfortable giving his injections and they would prefer continuing to come to [facility] to receive his every 2-week injections of Humira. He has had a growth on his left cheek recently and he wants it removed.  The following portions of the chart were reviewed this encounter and updated as appropriate:  Tobacco  Allergies  Meds  Problems  Med Hx  Surg Hx  Fam Hx     Review of Systems:  No other skin or systemic complaints except as noted in HPI or Assessment and Plan.  Objective  Well appearing patient in no apparent distress; mood and affect are within normal limits.  A focused examination was performed including trunk, face, scalp. Relevant physical exam findings are noted in the Assessment and Plan.  Objective  Trunk, scalp, face: Spotty hyperpigmentation on trunk  Objective  Left medial cheek: 0.7cm hyperkeratotic papule Wart vs ISK r/o SCC   Assessment & Plan  Psoriasis -severe and generalized much improved on systemic Humira injections.  Mostly clear.  Chronic, not currently at goal.    Trunk, scalp, face Psoriasis - severe on systemic "biologic" treatment injections.  Psoriasis is a chronic non-curable, but treatable genetic/hereditary disease that may have other systemic features affecting other organ systems such as joints (Psoriatic Arthritis).  It is linked with heart disease, inflammatory bowel disease, non-alcoholic fatty liver disease, and depression. Significant skin psoriasis and/or psoriatic arthritis may have significant symptoms and affects  activities of daily activity and often benefits from systemic "biologic" injection treatments.  These "biologic" treatments have some potential side effects including immunosuppression and require pre-treatment laboratory screening and periodic laboratory monitoring and periodic in person evaluation and monitoring by the attending dermatologist physician.  Continue Humira 40mg  every 2 weeks  Other Related Medications Adalimumab (HUMIRA PEN) 40 MG/0.4ML PNKT  Neoplasm of uncertain behavior Left medial cheek  Epidermal / dermal shaving  Lesion diameter (cm):  0.7 Informed consent: discussed and consent obtained   Timeout: patient name, date of birth, surgical site, and procedure verified   Procedure prep:  Patient was prepped and draped in usual sterile fashion Prep type:  Isopropyl alcohol Anesthesia: the lesion was anesthetized in a standard fashion   Anesthetic:  1% lidocaine w/ epinephrine 1-100,000 buffered w/ 8.4% NaHCO3 Instrument used: flexible razor blade   Hemostasis achieved with: pressure, aluminum chloride and electrodesiccation   Outcome: patient tolerated procedure well   Post-procedure details: sterile dressing applied and wound care instructions given   Dressing type: bandage and petrolatum    Specimen 1 - Surgical pathology Differential Diagnosis:  Check Margins: No 0.7cm hyperkeratotic papule Wart vs ISK r/o SCC  Return in about 2 weeks (around 01/21/2020) for Psoriasis.  Graciella Belton, RMA, am acting as scribe for Sarina Ser, MD . Documentation: I have reviewed the above documentation for accuracy and completeness, and I agree with the above.  Sarina Ser, MD

## 2020-01-10 ENCOUNTER — Telehealth: Payer: Self-pay

## 2020-01-10 ENCOUNTER — Encounter: Payer: Self-pay | Admitting: Dermatology

## 2020-01-10 DIAGNOSIS — Z23 Encounter for immunization: Secondary | ICD-10-CM | POA: Diagnosis not present

## 2020-01-10 NOTE — Telephone Encounter (Signed)
Unable to leave a message.

## 2020-01-10 NOTE — Telephone Encounter (Signed)
-----   Message from Ralene Bathe, MD sent at 01/08/2020  6:29 PM EST ----- Diagnosis Skin , left medial cheek VERRUCA VULGARIS, IRRITATED  Benign viral wart May recur Recheck next visit

## 2020-01-15 ENCOUNTER — Telehealth: Payer: Self-pay

## 2020-01-15 NOTE — Telephone Encounter (Signed)
-----   Message from Ralene Bathe, MD sent at 01/08/2020  6:29 PM EST ----- Diagnosis Skin , left medial cheek VERRUCA VULGARIS, IRRITATED  Benign viral wart May recur Recheck next visit

## 2020-01-15 NOTE — Telephone Encounter (Signed)
No answer, unable to leave a message. °

## 2020-01-21 ENCOUNTER — Ambulatory Visit (INDEPENDENT_AMBULATORY_CARE_PROVIDER_SITE_OTHER): Payer: Medicare Other

## 2020-01-21 ENCOUNTER — Other Ambulatory Visit: Payer: Self-pay

## 2020-01-21 ENCOUNTER — Telehealth: Payer: Self-pay

## 2020-01-21 DIAGNOSIS — L400 Psoriasis vulgaris: Secondary | ICD-10-CM

## 2020-01-21 NOTE — Progress Notes (Signed)
Patient here for two week Humira injection.  Humira 40mg  injected into the left lower abdomen. Patient tolerated well.  LOT: [id number]  EXP: April 2023

## 2020-01-21 NOTE — Telephone Encounter (Signed)
-----   Message from Ralene Bathe, MD sent at 01/08/2020  6:29 PM EST ----- Diagnosis Skin , left medial cheek VERRUCA VULGARIS, IRRITATED  Benign viral wart May recur Recheck next visit

## 2020-01-21 NOTE — Telephone Encounter (Signed)
Patient and care giver advised of BX results during nurse visit today.

## 2020-02-04 ENCOUNTER — Ambulatory Visit (INDEPENDENT_AMBULATORY_CARE_PROVIDER_SITE_OTHER): Payer: Medicare Other

## 2020-02-04 ENCOUNTER — Other Ambulatory Visit: Payer: Self-pay

## 2020-02-04 DIAGNOSIS — L400 Psoriasis vulgaris: Secondary | ICD-10-CM

## 2020-02-04 NOTE — Progress Notes (Signed)
Patient here for two week Humira injection.  Humira 40mg  injected SQ into patients lower right abdomen. Patient tolerated well.  LOT: [id number] EXP: April 2023

## 2020-02-19 ENCOUNTER — Other Ambulatory Visit: Payer: Self-pay

## 2020-02-19 ENCOUNTER — Ambulatory Visit (INDEPENDENT_AMBULATORY_CARE_PROVIDER_SITE_OTHER): Payer: Medicare Other

## 2020-02-19 DIAGNOSIS — L400 Psoriasis vulgaris: Secondary | ICD-10-CM

## 2020-02-19 NOTE — Progress Notes (Signed)
Patient here for two week Humira injection.    Humira 40mg /0.49mL injected SQ into left lower abdomen. Patient tolerated well.    LOT: [id number] EXP: 06/2021

## 2020-03-04 ENCOUNTER — Other Ambulatory Visit: Payer: Self-pay

## 2020-03-04 ENCOUNTER — Ambulatory Visit (INDEPENDENT_AMBULATORY_CARE_PROVIDER_SITE_OTHER): Payer: Medicare Other

## 2020-03-04 DIAGNOSIS — L409 Psoriasis, unspecified: Secondary | ICD-10-CM

## 2020-03-04 DIAGNOSIS — L400 Psoriasis vulgaris: Secondary | ICD-10-CM | POA: Diagnosis not present

## 2020-03-04 NOTE — Progress Notes (Signed)
Patient here for two week Humira injection.    Humira 40mg /0.49mL injected SQ into right lower abdomen. Patient tolerated well.    LOT: [id number] EXP: 06/2021

## 2020-03-18 ENCOUNTER — Other Ambulatory Visit: Payer: Self-pay

## 2020-03-18 ENCOUNTER — Ambulatory Visit: Payer: Medicare Other

## 2020-03-20 ENCOUNTER — Ambulatory Visit (INDEPENDENT_AMBULATORY_CARE_PROVIDER_SITE_OTHER): Payer: Medicare Other

## 2020-03-20 ENCOUNTER — Other Ambulatory Visit: Payer: Self-pay

## 2020-03-20 DIAGNOSIS — L400 Psoriasis vulgaris: Secondary | ICD-10-CM

## 2020-03-20 NOTE — Progress Notes (Signed)
Patient here for two week Humira injection.  Humira 40mg  injected into left lower abdomen. Patient tolerated well.  LOT: [id number] EXP: May 2023

## 2020-04-01 ENCOUNTER — Ambulatory Visit: Payer: Medicare Other

## 2020-04-02 ENCOUNTER — Ambulatory Visit: Payer: Medicare Other

## 2020-04-03 ENCOUNTER — Ambulatory Visit: Payer: Self-pay

## 2020-05-01 ENCOUNTER — Other Ambulatory Visit: Payer: Self-pay

## 2020-05-01 ENCOUNTER — Ambulatory Visit (INDEPENDENT_AMBULATORY_CARE_PROVIDER_SITE_OTHER): Payer: Medicare Other

## 2020-05-01 DIAGNOSIS — L400 Psoriasis vulgaris: Secondary | ICD-10-CM

## 2020-05-01 NOTE — Progress Notes (Signed)
Patient here for Humira injection.   Humira 40mg  injected into right lower abdomen. Patient tolerated well.   LOT: [id number] EXP: May 2023

## 2020-05-15 ENCOUNTER — Ambulatory Visit (INDEPENDENT_AMBULATORY_CARE_PROVIDER_SITE_OTHER): Payer: Medicare Other

## 2020-05-15 ENCOUNTER — Other Ambulatory Visit: Payer: Self-pay

## 2020-05-15 DIAGNOSIS — L400 Psoriasis vulgaris: Secondary | ICD-10-CM

## 2020-05-15 NOTE — Progress Notes (Signed)
Patient here for two week Humira injection.    Humira 40mg /0.50mL injected SQ into left lower abdomen. Patient tolerated well.    LOT: [id number] EXP: 09/2021

## 2020-05-29 ENCOUNTER — Other Ambulatory Visit: Payer: Self-pay

## 2020-05-29 ENCOUNTER — Ambulatory Visit (INDEPENDENT_AMBULATORY_CARE_PROVIDER_SITE_OTHER): Payer: Medicare Other

## 2020-05-29 DIAGNOSIS — L400 Psoriasis vulgaris: Secondary | ICD-10-CM | POA: Diagnosis not present

## 2020-05-29 NOTE — Progress Notes (Signed)
Patient here for two week Humira injection.   Humira 40mg  injected into right lower abdomen. Patient tolerated well.  LOT: [id number] EXP: 09/22/2021

## 2020-06-12 ENCOUNTER — Ambulatory Visit (INDEPENDENT_AMBULATORY_CARE_PROVIDER_SITE_OTHER): Payer: Medicare Other

## 2020-06-12 ENCOUNTER — Other Ambulatory Visit: Payer: Self-pay

## 2020-06-12 DIAGNOSIS — L400 Psoriasis vulgaris: Secondary | ICD-10-CM | POA: Diagnosis not present

## 2020-06-12 NOTE — Progress Notes (Signed)
Patient here for two week Humira injection.    Humira 40mg /0.54mL injected SQ into left lower abdomen. Patient tolerated well.    LOT: [id number] EXP: 09/2021

## 2020-06-26 ENCOUNTER — Other Ambulatory Visit: Payer: Self-pay

## 2020-06-26 ENCOUNTER — Ambulatory Visit: Payer: Medicare Other

## 2020-07-01 ENCOUNTER — Ambulatory Visit (INDEPENDENT_AMBULATORY_CARE_PROVIDER_SITE_OTHER): Payer: Medicare Other

## 2020-07-01 ENCOUNTER — Other Ambulatory Visit: Payer: Self-pay

## 2020-07-01 DIAGNOSIS — L400 Psoriasis vulgaris: Secondary | ICD-10-CM | POA: Diagnosis not present

## 2020-07-01 NOTE — Progress Notes (Signed)
Patient here for two week Humira injection.    Humira 40mg /0.72mL injected SQ into right lower abdomen. Patient tolerated well.    LOT: [id number] EXP: 07/2021

## 2020-07-10 ENCOUNTER — Other Ambulatory Visit: Payer: Self-pay

## 2020-07-10 ENCOUNTER — Ambulatory Visit (INDEPENDENT_AMBULATORY_CARE_PROVIDER_SITE_OTHER): Payer: Medicare Other | Admitting: Dermatology

## 2020-07-10 DIAGNOSIS — L409 Psoriasis, unspecified: Secondary | ICD-10-CM

## 2020-07-10 MED ORDER — ADALIMUMAB 40 MG/0.4ML ~~LOC~~ AJKT
40.0000 mg | AUTO-INJECTOR | Freq: Once | SUBCUTANEOUS | Status: AC
  Administered 2020-07-10: 40 mg via SUBCUTANEOUS

## 2020-07-10 NOTE — Progress Notes (Signed)
   Follow-Up Visit   Subjective  Aldo Sadiel Mota is a 65 y.o. male who presents for the following: Psoriasis (Trunk, scalp, face, 18m f/u, Humira 40mg  sub q injections q 2 wks). Patient accompanied by caregiver who contributes to history.  The following portions of the chart were reviewed this encounter and updated as appropriate:   Tobacco  Allergies  Meds  Problems  Med Hx  Surg Hx  Fam Hx     Review of Systems:  No other skin or systemic complaints except as noted in HPI or Assessment and Plan.  Objective  Well appearing patient in no apparent distress; mood and affect are within normal limits.  A focused examination was performed including face, scalp, arms, trunk. Relevant physical exam findings are noted in the Assessment and Plan.  Objective  scalp, trumk, extremities: Minimal hyperpigmentation mid back,    Assessment & Plan  Psoriasis scalp, trumk, extremities  Psoriasis - severe on systemic "biologic" treatment injections.  Psoriasis is a chronic non-curable, but treatable genetic/hereditary disease that may have other systemic features affecting other organ systems such as joints (Psoriatic Arthritis).  It is linked with heart disease, inflammatory bowel disease, non-alcoholic fatty liver disease, and depression. Significant skin psoriasis and/or psoriatic arthritis may have significant symptoms and affects activities of daily activity and often benefits from systemic "biologic" injection treatments.  These "biologic" treatments have some potential side effects including immunosuppression and require pre-treatment laboratory screening and periodic laboratory monitoring and periodic in person evaluation and monitoring by the attending dermatologist physician.   Improved on Humira Cont Humira 40mg  sq injections q 2 wks  Humira 40mg  sq injection today to L abdomen Lot #[id number] exp  07/2021  Reviewed risks of biologics including immunosuppression, infections,  injection site reaction, and failure to improve condition. Goal is control of skin condition, not cure.  Some older biologics such as Humira and Enbrel may slightly increase risk of malignancy and may worsen congestive heart failure. The use of biologics requires long term medication management, including periodic office visits and monitoring of blood work.  Joints not aching  Adalimumab PNKT 40 mg - scalp, trumk, extremities  Other Related Procedures Comprehensive metabolic panel CBC with Differential/Platelet QuantiFERON-TB Gold Plus  Other Related Medications Adalimumab (HUMIRA PEN) 40 MG/0.4ML PNKT  Return in about 3 months (around 10/10/2020) for Psoriasis f/u with Dr. Nehemiah Massed, q 2 wks with nurse for Humira injections.  I, Othelia Pulling, RMA, am acting as scribe for Sarina Ser, MD .  Documentation: I have reviewed the above documentation for accuracy and completeness, and I agree with the above.  Sarina Ser, MD

## 2020-07-10 NOTE — Patient Instructions (Signed)

## 2020-07-15 ENCOUNTER — Other Ambulatory Visit: Payer: Self-pay

## 2020-07-15 DIAGNOSIS — L409 Psoriasis, unspecified: Secondary | ICD-10-CM

## 2020-07-15 MED ORDER — HUMIRA (2 PEN) 40 MG/0.4ML ~~LOC~~ AJKT: 40.0000 mg | AUTO-INJECTOR | SUBCUTANEOUS | 5 refills | Status: DC

## 2020-07-15 NOTE — Progress Notes (Signed)
RX RFs approved that were sent through fax.

## 2020-07-18 ENCOUNTER — Encounter: Payer: Self-pay | Admitting: Dermatology

## 2020-07-22 ENCOUNTER — Other Ambulatory Visit: Payer: Self-pay | Admitting: Dermatology

## 2020-07-24 ENCOUNTER — Ambulatory Visit: Payer: Medicare Other

## 2020-07-24 LAB — CBC WITH DIFFERENTIAL/PLATELET
Basophils Absolute: 0 10*3/uL (ref 0.0–0.2)
Basos: 1 %
EOS (ABSOLUTE): 0.3 10*3/uL (ref 0.0–0.4)
Eos: 3 %
Hematocrit: 39.2 % (ref 37.5–51.0)
Hemoglobin: 12.6 g/dL — ABNORMAL LOW (ref 13.0–17.7)
Immature Grans (Abs): 0 10*3/uL (ref 0.0–0.1)
Immature Granulocytes: 0 %
Lymphocytes Absolute: 2.8 10*3/uL (ref 0.7–3.1)
Lymphs: 35 %
MCH: 28.3 pg (ref 26.6–33.0)
MCHC: 32.1 g/dL (ref 31.5–35.7)
MCV: 88 fL (ref 79–97)
Monocytes Absolute: 0.5 10*3/uL (ref 0.1–0.9)
Monocytes: 7 %
Neutrophils Absolute: 4.3 10*3/uL (ref 1.4–7.0)
Neutrophils: 54 %
Platelets: 108 10*3/uL — ABNORMAL LOW (ref 150–450)
RBC: 4.46 x10E6/uL (ref 4.14–5.80)
RDW: 11.8 % (ref 11.6–15.4)
WBC: 7.9 10*3/uL (ref 3.4–10.8)

## 2020-07-24 LAB — COMPREHENSIVE METABOLIC PANEL
ALT: 19 IU/L (ref 0–44)
AST: 16 IU/L (ref 0–40)
Albumin/Globulin Ratio: 1.1 — ABNORMAL LOW (ref 1.2–2.2)
Albumin: 3.8 g/dL (ref 3.8–4.9)
Alkaline Phosphatase: 119 IU/L (ref 44–121)
BUN/Creatinine Ratio: 11 (ref 9–20)
BUN: 12 mg/dL (ref 6–24)
Bilirubin Total: 0.2 mg/dL (ref 0.0–1.2)
CO2: 23 mmol/L (ref 20–29)
Calcium: 9 mg/dL (ref 8.7–10.2)
Chloride: 106 mmol/L (ref 96–106)
Creatinine, Ser: 1.12 mg/dL (ref 0.76–1.27)
Globulin, Total: 3.6 g/dL (ref 1.5–4.5)
Glucose: 101 mg/dL — ABNORMAL HIGH (ref 65–99)
Potassium: 4.2 mmol/L (ref 3.5–5.2)
Sodium: 142 mmol/L (ref 134–144)
Total Protein: 7.4 g/dL (ref 6.0–8.5)
eGFR: 77 mL/min/{1.73_m2} (ref >59)

## 2020-07-24 LAB — QUANTIFERON-TB GOLD PLUS
QuantiFERON Mitogen Value: >10 IU/mL
QuantiFERON Nil Value: 0.02 IU/mL
QuantiFERON TB1 Ag Value: 0.04 IU/mL
QuantiFERON TB2 Ag Value: 0.05 IU/mL
QuantiFERON-TB Gold Plus: NEGATIVE

## 2020-07-28 ENCOUNTER — Telehealth: Payer: Self-pay

## 2020-07-28 ENCOUNTER — Ambulatory Visit: Payer: Medicare Other

## 2020-07-28 NOTE — Telephone Encounter (Signed)
-----   Message from Ralene Bathe, MD sent at 07/25/2020 12:56 PM EDT ----- Pt on Humira for severe Psoriasis - doing very well on Humira. Labs are OK. Hgb slightly low, but improved from past Platelets mildly low. TB test/ Quantiferon gold = negative/normal.  Please send notification to pts PCP to review labs - especially CBC and diff for above low values. From standpoint of Psoriasis and Humira, pt may continue current treatment. Keep fu appts.

## 2020-07-28 NOTE — Telephone Encounter (Signed)
No voicemail box set up. Unable to leave a message.

## 2020-07-29 ENCOUNTER — Ambulatory Visit (INDEPENDENT_AMBULATORY_CARE_PROVIDER_SITE_OTHER): Payer: Medicare Other

## 2020-07-29 ENCOUNTER — Other Ambulatory Visit: Payer: Self-pay

## 2020-07-29 DIAGNOSIS — L409 Psoriasis, unspecified: Secondary | ICD-10-CM

## 2020-07-29 NOTE — Progress Notes (Signed)
Patient here for two week Humira injection.    Humira 40mg /0.74mL injected SQ into right lower abdomen. Patient tolerated well.    LOT: [id number] EXP: 12/2021

## 2020-08-01 ENCOUNTER — Telehealth: Payer: Self-pay

## 2020-08-01 NOTE — Telephone Encounter (Signed)
Advised patient's care giver of lab results and forwarded results to his PCP. Advised to continue Humira/hd

## 2020-08-01 NOTE — Telephone Encounter (Signed)
-----   Message from Ralene Bathe, MD sent at 07/25/2020 12:56 PM EDT ----- Pt on Humira for severe Psoriasis - doing very well on Humira. Labs are OK. Hgb slightly low, but improved from past Platelets mildly low. TB test/ Quantiferon gold = negative/normal.  Please send notification to pts PCP to review labs - especially CBC and diff for above low values. From standpoint of Psoriasis and Humira, pt may continue current treatment. Keep fu appts.

## 2020-08-12 ENCOUNTER — Other Ambulatory Visit: Payer: Self-pay

## 2020-08-12 ENCOUNTER — Ambulatory Visit (INDEPENDENT_AMBULATORY_CARE_PROVIDER_SITE_OTHER): Payer: Medicare Other

## 2020-08-12 DIAGNOSIS — L409 Psoriasis, unspecified: Secondary | ICD-10-CM

## 2020-08-12 NOTE — Progress Notes (Signed)
Patient here for two week Humira injection.    Humira 40mg /0.87mL injected SQ into left lower abdomen. Patient tolerated well.    LOT: [id number]  EXP: 12/2021

## 2020-08-26 ENCOUNTER — Other Ambulatory Visit: Payer: Self-pay

## 2020-08-26 ENCOUNTER — Ambulatory Visit: Payer: Medicare Other

## 2020-08-27 ENCOUNTER — Ambulatory Visit (INDEPENDENT_AMBULATORY_CARE_PROVIDER_SITE_OTHER): Payer: Medicare Other

## 2020-08-27 DIAGNOSIS — L409 Psoriasis, unspecified: Secondary | ICD-10-CM | POA: Diagnosis not present

## 2020-08-27 NOTE — Progress Notes (Signed)
Patient here for two week Humira injection.    Humira 40mg /3.5KT injected SQ into right lower abdomen. Patient tolerated well.    LOT: [id number] EXP: 12/2021

## 2020-09-09 ENCOUNTER — Ambulatory Visit: Payer: Medicare Other

## 2020-09-10 ENCOUNTER — Ambulatory Visit: Payer: Medicare Other

## 2020-09-11 ENCOUNTER — Other Ambulatory Visit: Payer: Self-pay

## 2020-09-11 ENCOUNTER — Ambulatory Visit (INDEPENDENT_AMBULATORY_CARE_PROVIDER_SITE_OTHER): Payer: Medicare Other

## 2020-09-11 DIAGNOSIS — L400 Psoriasis vulgaris: Secondary | ICD-10-CM

## 2020-09-11 NOTE — Progress Notes (Signed)
Patient here for two week Humira injection.   Humira '40mg'$ /0.74m injected into patient's lower left abdomen. Patient tolerated well.  LOT:[id number]:[id number]

## 2020-09-25 ENCOUNTER — Other Ambulatory Visit: Payer: Self-pay

## 2020-09-25 ENCOUNTER — Ambulatory Visit (INDEPENDENT_AMBULATORY_CARE_PROVIDER_SITE_OTHER): Payer: Medicare Other

## 2020-09-25 DIAGNOSIS — L400 Psoriasis vulgaris: Secondary | ICD-10-CM

## 2020-09-25 NOTE — Progress Notes (Signed)
Patient here for two week Humira injection.  Humira '40mg'$ /0.57m injected into right lower abdomen. Patient tolerated well.  LOT: [id number]: 12/2021

## 2020-10-08 ENCOUNTER — Other Ambulatory Visit: Payer: Self-pay

## 2020-10-08 ENCOUNTER — Ambulatory Visit (INDEPENDENT_AMBULATORY_CARE_PROVIDER_SITE_OTHER): Payer: Medicare Other

## 2020-10-08 DIAGNOSIS — L400 Psoriasis vulgaris: Secondary | ICD-10-CM | POA: Diagnosis not present

## 2020-10-08 NOTE — Progress Notes (Signed)
Patient here for two week Humira injection.    Humira '40mg'$ /0.75m injected SQ into left lower abdomen. Patient tolerated well.    LOT: [id number]: 12/2021

## 2020-10-09 ENCOUNTER — Ambulatory Visit: Payer: Medicare Other

## 2020-10-23 ENCOUNTER — Ambulatory Visit: Payer: Self-pay | Admitting: Dermatology

## 2020-10-29 ENCOUNTER — Ambulatory Visit: Payer: Medicare Other

## 2020-10-29 ENCOUNTER — Ambulatory Visit (INDEPENDENT_AMBULATORY_CARE_PROVIDER_SITE_OTHER): Payer: Medicare Other | Admitting: Dermatology

## 2020-10-29 ENCOUNTER — Other Ambulatory Visit: Payer: Self-pay

## 2020-10-29 DIAGNOSIS — L409 Psoriasis, unspecified: Secondary | ICD-10-CM

## 2020-10-29 DIAGNOSIS — Z79899 Other long term (current) drug therapy: Secondary | ICD-10-CM

## 2020-10-29 MED ORDER — ADALIMUMAB 40 MG/0.4ML ~~LOC~~ AJKT
40.0000 mg | AUTO-INJECTOR | Freq: Once | SUBCUTANEOUS | Status: AC
  Administered 2020-10-29: 40 mg via SUBCUTANEOUS

## 2020-10-29 NOTE — Patient Instructions (Signed)

## 2020-10-29 NOTE — Progress Notes (Signed)
   Follow-Up Visit   Subjective  Brandon Singh is a 65 y.o. male who presents for the following: Psoriasis (Patient doing well with Humira SQ QOW he is here today for his injection and to follow up on condition.).  The following portions of the chart were reviewed this encounter and updated as appropriate:   Tobacco  Allergies  Meds  Problems  Med Hx  Surg Hx  Fam Hx     Review of Systems:  No other skin or systemic complaints except as noted in HPI or Assessment and Plan.  Objective  Well appearing patient in no apparent distress; mood and affect are within normal limits.  A focused examination was performed including the trunk and extremities. Relevant physical exam findings are noted in the Assessment and Plan.  Trunk, extremities Hyperpigmentation along the spine and elbows otherwise clear.   Assessment & Plan  Psoriasis Trunk, extremities  Psoriasis is a chronic non-curable, but treatable genetic/hereditary disease that may have other systemic features affecting other organ systems such as joints (Psoriatic Arthritis). It is associated with an increased risk of inflammatory bowel disease, heart disease, non-alcoholic fatty liver disease, and depression.    Continue Humira '40mg'$ /0.58m SQ QOW. Reviewed risks of biologics including immunosuppression, infections, injection site reaction, and failure to improve condition. Goal is control of skin condition, not cure.  Some older biologics such as Humira and Enbrel may slightly increase risk of malignancy and may worsen congestive heart failure. The use of biologics requires long term medication management, including periodic office visits and monitoring of blood work.  Humira injected SQ into the RLQA. Patient tolerated injection well. AL, CMA  Adalimumab PNKT 40 mg - Trunk, extremities  Laboratory and TB screening due June 2023. Lab reviewed from May 2022 and is okay.  See comments.  Related Medications Adalimumab  (HUMIRA PEN) 40 MG/0.4ML PNKT Inject 40 mg into the skin every 14 (fourteen) days. For maintenance.  Return in about 3 months (around 01/28/2021) for psoriasis follow up, nurse visit in 2 weeks for Humira injection.  ILuther Redo CMA, am acting as scribe for DSarina Ser MD .  Documentation: I have reviewed the above documentation for accuracy and completeness, and I agree with the above.  DSarina Ser MD

## 2020-10-30 ENCOUNTER — Encounter: Payer: Self-pay | Admitting: Dermatology

## 2020-11-12 ENCOUNTER — Ambulatory Visit (INDEPENDENT_AMBULATORY_CARE_PROVIDER_SITE_OTHER): Payer: Medicare Other

## 2020-11-12 ENCOUNTER — Other Ambulatory Visit: Payer: Self-pay

## 2020-11-12 DIAGNOSIS — L400 Psoriasis vulgaris: Secondary | ICD-10-CM

## 2020-11-12 NOTE — Progress Notes (Signed)
Patient here for two week Humira injection.    Humira 40mg /0.53mL injected SQ into left lower abdomen. Patient tolerated well.    LOT: [id number] EXP: 01/2022

## 2020-11-26 ENCOUNTER — Ambulatory Visit (INDEPENDENT_AMBULATORY_CARE_PROVIDER_SITE_OTHER): Payer: Medicare Other

## 2020-11-26 ENCOUNTER — Other Ambulatory Visit: Payer: Self-pay

## 2020-11-26 DIAGNOSIS — L400 Psoriasis vulgaris: Secondary | ICD-10-CM | POA: Diagnosis not present

## 2020-11-26 MED ORDER — ADALIMUMAB 40 MG/0.4ML ~~LOC~~ AJKT
40.0000 mg | AUTO-INJECTOR | Freq: Once | SUBCUTANEOUS | Status: AC
  Administered 2020-11-26: 40 mg via SUBCUTANEOUS

## 2020-11-26 NOTE — Progress Notes (Signed)
Patient here for two week Humira injection. HUmira 40mg  into right lower abdomen. Patient tolerated well.  LOT: [id number] EXP: JAN 2024

## 2020-12-10 ENCOUNTER — Ambulatory Visit (INDEPENDENT_AMBULATORY_CARE_PROVIDER_SITE_OTHER): Payer: Medicare Other

## 2020-12-10 ENCOUNTER — Other Ambulatory Visit: Payer: Self-pay

## 2020-12-10 DIAGNOSIS — L400 Psoriasis vulgaris: Secondary | ICD-10-CM

## 2020-12-10 MED ORDER — ADALIMUMAB 40 MG/0.4ML ~~LOC~~ AJKT
40.0000 mg | AUTO-INJECTOR | Freq: Once | SUBCUTANEOUS | Status: AC
  Administered 2020-12-10: 40 mg via SUBCUTANEOUS

## 2020-12-10 NOTE — Progress Notes (Signed)
Patient here for two week Humira injection.    Humira 40mg /0.44mL injected SQ into left lower abdomen. Patient tolerated well.    LOT: [id number] EXP: 02/2022

## 2020-12-15 ENCOUNTER — Other Ambulatory Visit: Payer: Self-pay

## 2020-12-15 DIAGNOSIS — L409 Psoriasis, unspecified: Secondary | ICD-10-CM

## 2020-12-15 MED ORDER — HUMIRA (2 PEN) 40 MG/0.4ML ~~LOC~~ AJKT: 40.0000 mg | AUTO-INJECTOR | SUBCUTANEOUS | 1 refills | Status: DC

## 2020-12-24 ENCOUNTER — Other Ambulatory Visit: Payer: Self-pay

## 2020-12-24 ENCOUNTER — Ambulatory Visit (INDEPENDENT_AMBULATORY_CARE_PROVIDER_SITE_OTHER): Payer: Medicare Other

## 2020-12-24 DIAGNOSIS — L409 Psoriasis, unspecified: Secondary | ICD-10-CM

## 2020-12-24 NOTE — Progress Notes (Signed)
Patient here for two week Humira injection.    Humira 40mg /0.13mL injected SQ into right lower abdomen. Patient tolerated well.    LOT: [id number] EXP: 03/2022

## 2021-01-07 ENCOUNTER — Ambulatory Visit (INDEPENDENT_AMBULATORY_CARE_PROVIDER_SITE_OTHER): Payer: Medicare Other

## 2021-01-07 ENCOUNTER — Other Ambulatory Visit: Payer: Self-pay

## 2021-01-07 DIAGNOSIS — L400 Psoriasis vulgaris: Secondary | ICD-10-CM | POA: Diagnosis not present

## 2021-01-07 NOTE — Progress Notes (Signed)
Patient here today for two week Humira injection. Humira 40mg /0.18mL injected into the left lower abdomen. Patient tolerated well.   LOT: [id number] EXP: 03/2022

## 2021-01-28 ENCOUNTER — Ambulatory Visit: Payer: Medicare Other | Admitting: Dermatology

## 2021-01-28 ENCOUNTER — Other Ambulatory Visit: Payer: Self-pay

## 2021-01-28 DIAGNOSIS — L409 Psoriasis, unspecified: Secondary | ICD-10-CM

## 2021-01-28 MED ORDER — HUMIRA (2 PEN) 40 MG/0.4ML ~~LOC~~ AJKT: 40.0000 mg | AUTO-INJECTOR | SUBCUTANEOUS | 1 refills | Status: DC

## 2021-02-09 ENCOUNTER — Other Ambulatory Visit: Payer: Self-pay

## 2021-02-09 DIAGNOSIS — L409 Psoriasis, unspecified: Secondary | ICD-10-CM

## 2021-02-09 MED ORDER — HUMIRA (2 PEN) 40 MG/0.4ML ~~LOC~~ AJKT: 40.0000 mg | AUTO-INJECTOR | SUBCUTANEOUS | 3 refills | Status: DC

## 2021-03-11 ENCOUNTER — Encounter: Payer: Self-pay | Admitting: Dermatology

## 2021-03-11 ENCOUNTER — Other Ambulatory Visit: Payer: Self-pay

## 2021-03-11 ENCOUNTER — Ambulatory Visit (INDEPENDENT_AMBULATORY_CARE_PROVIDER_SITE_OTHER): Payer: Medicare Other | Admitting: Dermatology

## 2021-03-11 DIAGNOSIS — L409 Psoriasis, unspecified: Secondary | ICD-10-CM | POA: Diagnosis not present

## 2021-03-11 DIAGNOSIS — Z79899 Other long term (current) drug therapy: Secondary | ICD-10-CM | POA: Diagnosis not present

## 2021-03-11 DIAGNOSIS — L4050 Arthropathic psoriasis, unspecified: Secondary | ICD-10-CM

## 2021-03-11 DIAGNOSIS — L853 Xerosis cutis: Secondary | ICD-10-CM | POA: Diagnosis not present

## 2021-03-11 MED ORDER — ADALIMUMAB 40 MG/0.4ML ~~LOC~~ AJKT
40.0000 mg | AUTO-INJECTOR | Freq: Once | SUBCUTANEOUS | Status: AC
  Administered 2021-03-11: 40 mg via SUBCUTANEOUS

## 2021-03-11 NOTE — Patient Instructions (Addendum)
Recommend Cerave cream daily  If You Need Anything After Your Visit  If you have any questions or concerns for your doctor, please call our main line at [phone] and press option 4 to reach your doctor's medical assistant. If no one answers, please leave a voicemail as directed and we will return your call as soon as possible. Messages left after 4 pm will be answered the following business day.   You may also send [place] a message via [place]. We typically respond to MyChart messages within 1-2 business days.  For prescription refills, please ask your pharmacy to contact our office. Our fax number is [phone].  If you have an urgent issue when the clinic is closed that cannot wait until the next business day, you can page your doctor at the number below.    Please note that while we do our best to be available for urgent issues outside of office hours, we are not available 24/7.   If you have an urgent issue and are unable to reach [place], you may choose to seek medical care at your doctor's office, retail clinic, urgent care center, or emergency room.  If you have a medical emergency, please immediately call 911 or go to the emergency department.  Pager Numbers  - Dr. Nehemiah Massed: [phone]  - Dr. Laurence Ferrari: [phone]  - Dr. Nicole Kindred: [phone]  In the event of inclement weather, please call our main line at [phone] for an update on the status of any delays or closures.  Dermatology Medication Tips: Please keep the boxes that topical medications come in in order to help keep track of the instructions about where and how to use these. Pharmacies typically print the medication instructions only on the boxes and not directly on the medication tubes.   If your medication is too expensive, please contact our office at [phone] option 4 or send [place] a message through [place].   We are unable to tell what your co-pay for medications will be in advance as this is different depending  on your insurance coverage. However, we may be able to find a substitute medication at lower cost or fill out paperwork to get insurance to cover a needed medication.   If a prior authorization is required to get your medication covered by your insurance company, please allow [place] 1-2 business days to complete this process.  Drug prices often vary depending on where the prescription is filled and some pharmacies may offer cheaper prices.  The website www.goodrx.com contains coupons for medications through different pharmacies. The prices here do not account for what the cost may be with help from insurance (it may be cheaper with your insurance), but the website can give you the price if you did not use any insurance.  - You can print the associated coupon and take it with your prescription to the pharmacy.  - You may also stop by our office during regular business hours and pick up a GoodRx coupon card.  - If you need your prescription sent electronically to a different pharmacy, notify our office through [facility] or by phone at [phone] option 4.     Si Usted Necesita Algo Despus de Su Visita  Tambin puede enviarnos un mensaje a travs de Pharmacist, community. Por lo general respondemos a los mensajes de MyChart en el transcurso de 1 a 2 das hbiles.  Para renovar recetas, por favor pida a su farmacia que se ponga en contacto con nuestra oficina. Harland Dingwall de fax es  el [phone].  Si tiene un asunto urgente cuando la clnica est cerrada y que no puede esperar hasta el siguiente da hbil, puede llamar/localizar a su doctor(a) al nmero que aparece a continuacin.   Por favor, tenga en cuenta que aunque hacemos todo lo posible para estar disponibles para asuntos urgentes fuera del horario de [place], no estamos disponibles las 24 horas del da, los 7 das de la [place].   Si tiene un problema urgente y no puede comunicarse con nosotros, puede optar por buscar atencin mdica  en  el consultorio de su doctor(a), en una clnica privada, en un centro de atencin urgente o en una sala de emergencias.  Si tiene Engineering geologist, por favor llame inmediatamente al 911 o vaya a la sala de emergencias.  Nmeros de bper  - Dr. Nehemiah Massed: [phone]  - Dra. Moye: [phone]  - Dra. Nicole Kindred: [phone]  En caso de inclemencias del [place], por favor llame a Johnsie Kindred principal al [phone] para una actualizacin sobre el [place] de cualquier retraso o cierre.  Consejos para la medicacin en dermatologa: Por favor, guarde las cajas en las que vienen los medicamentos de uso tpico para ayudarle a seguir las instrucciones sobre dnde y cmo usarlos. Las farmacias generalmente imprimen las instrucciones del medicamento slo en las cajas y no directamente en los tubos del [place].   Si su medicamento es muy caro, por favor, pngase en contacto con Zigmund Daniel llamando al [phone] y presione la opcin 4 o envenos un mensaje a travs de Pharmacist, community.   No podemos decirle cul ser su copago por los medicamentos por adelantado ya que esto es diferente dependiendo de la cobertura de su seguro. Sin embargo, es posible que podamos encontrar un medicamento sustituto a Electrical engineer un formulario para que el seguro cubra el medicamento que se considera necesario.   Si se requiere una autorizacin previa para que su compaa de seguros [place] su medicamento, por favor permtanos de 1 a 2 das hbiles para completar este proceso.  Los precios de los medicamentos varan con frecuencia dependiendo del Environmental consultant de dnde se surte la receta y alguna farmacias pueden ofrecer precios ms baratos.  El sitio web www.goodrx.com tiene cupones para medicamentos de Airline pilot. Los precios aqu no tienen en cuenta lo que podra costar con la ayuda del seguro (puede ser ms barato con su seguro), pero el sitio web puede darle el precio si no utiliz Research scientist (physical sciences).  -  Puede imprimir el cupn correspondiente y llevarlo con su receta a la farmacia.  - Tambin puede pasar por nuestra oficina durante el horario de atencin regular y Charity fundraiser una tarjeta de cupones de GoodRx.  - Si necesita que su receta se enve electrnicamente a una farmacia diferente, informe a nuestra oficina a travs de MyChart de [place] o por telfono llamando al [phone] y presione la opcin 4.

## 2021-03-11 NOTE — Progress Notes (Signed)
° °  Follow-Up Visit   Subjective  Brandon Singh is a 66 y.o. male who presents for the following: Psoriasis (Humira injections - doing well. Had to have a nurse come to do his injection 2 weeks ago at home.).  Accompanied by caregiver who contributes to history.  The following portions of the chart were reviewed this encounter and updated as appropriate:   Tobacco   Allergies   Meds   Problems   Med Hx   Surg Hx   Fam Hx      Review of Systems:  No other skin or systemic complaints except as noted in HPI or Assessment and Plan.  Objective  Well appearing patient in no apparent distress; mood and affect are within normal limits.  A focused examination was performed including trunk, extremities. Relevant physical exam findings are noted in the Assessment and Plan.  Spotty macular hyperpigmentation - BSA 0% on treatment  Xerosis   Assessment & Plan  Psoriasis  Psoriasis with Psoriatic Arthritis - Well controlled  Psoriasis is a chronic non-curable, but treatable genetic/hereditary disease that may have other systemic features affecting other organ systems such as joints (Psoriatic Arthritis). It is associated with an increased risk of inflammatory bowel disease, heart disease, non-alcoholic fatty liver disease, and depression.    Continue Humira injection q2weeks Adalimumab PNKT 40 mg injection today  Long term medication management. Pt due for lab May 2023.  Related Medications Adalimumab (HUMIRA PEN) 40 MG/0.4ML PNKT Inject 40 mg into the skin every 14 (fourteen) days. For maintenance.  Xerosis cutis Recommend Cerave cream daily  Return in about 2 weeks (around 03/25/2021) for Injection on Nurse schedule, 4 months with Dr. Nehemiah Massed.  I, Ashok Cordia, CMA, am acting as scribe for Sarina Ser, MD . Documentation: I have reviewed the above documentation for accuracy and completeness, and I agree with the above.  Sarina Ser, MD

## 2021-03-25 ENCOUNTER — Other Ambulatory Visit: Payer: Self-pay

## 2021-03-25 ENCOUNTER — Ambulatory Visit (INDEPENDENT_AMBULATORY_CARE_PROVIDER_SITE_OTHER): Payer: Medicare Other

## 2021-03-25 DIAGNOSIS — L400 Psoriasis vulgaris: Secondary | ICD-10-CM

## 2021-03-25 NOTE — Progress Notes (Signed)
Patient here today for Humira injection.   Humira 40mg /0.26mL injected into right lower abdomen. Patient tolerated well.  LOT: [id number] EXP: 05/2022

## 2021-04-08 ENCOUNTER — Ambulatory Visit (INDEPENDENT_AMBULATORY_CARE_PROVIDER_SITE_OTHER): Payer: Medicare Other | Admitting: Dermatology

## 2021-04-08 ENCOUNTER — Other Ambulatory Visit: Payer: Self-pay

## 2021-04-08 DIAGNOSIS — L400 Psoriasis vulgaris: Secondary | ICD-10-CM

## 2021-04-08 MED ORDER — ADALIMUMAB 40 MG/0.4ML ~~LOC~~ AJKT
40.0000 mg | AUTO-INJECTOR | Freq: Once | SUBCUTANEOUS | Status: AC
  Administered 2021-04-08: 40 mg via SUBCUTANEOUS

## 2021-04-08 NOTE — Progress Notes (Signed)
Patient here today for two week Humira injection. Humira 40mg /0.60mL injected into left lower abdomen. Patient tolerated well.   LOT: [id number] EXP: 03/25/2022 Documentation: I have reviewed the above documentation for accuracy and completeness, and I agree with the above.  Sarina Ser, MD

## 2021-04-11 ENCOUNTER — Encounter: Payer: Self-pay | Admitting: Dermatology

## 2021-04-22 ENCOUNTER — Ambulatory Visit (INDEPENDENT_AMBULATORY_CARE_PROVIDER_SITE_OTHER): Payer: Medicare Other | Admitting: Dermatology

## 2021-04-22 ENCOUNTER — Other Ambulatory Visit: Payer: Self-pay

## 2021-04-22 DIAGNOSIS — L400 Psoriasis vulgaris: Secondary | ICD-10-CM | POA: Diagnosis not present

## 2021-04-22 MED ORDER — ADALIMUMAB 40 MG/0.4ML ~~LOC~~ AJKT
40.0000 mg | AUTO-INJECTOR | Freq: Once | SUBCUTANEOUS | Status: AC
  Administered 2021-04-22: 40 mg via SUBCUTANEOUS

## 2021-04-22 NOTE — Progress Notes (Signed)
Patient here today to two week Humira injection. Humira 40mg  injected into right lower abdomen. Patient tolerated well.  ? ?LOT: [id number] ?EXP: [id number] ? ?Johnsie Kindred, RMA ?Documentation: I have reviewed the above documentation for accuracy and completeness, and I agree with the above. ? ?Sarina Ser, MD ? ?

## 2021-04-24 ENCOUNTER — Encounter: Payer: Self-pay | Admitting: Dermatology

## 2021-05-06 ENCOUNTER — Other Ambulatory Visit: Payer: Self-pay

## 2021-05-06 ENCOUNTER — Ambulatory Visit (INDEPENDENT_AMBULATORY_CARE_PROVIDER_SITE_OTHER): Payer: Medicare Other | Admitting: Dermatology

## 2021-05-06 DIAGNOSIS — L400 Psoriasis vulgaris: Secondary | ICD-10-CM | POA: Diagnosis not present

## 2021-05-06 MED ORDER — ADALIMUMAB 40 MG/0.4ML ~~LOC~~ AJKT
40.0000 mg | AUTO-INJECTOR | Freq: Once | SUBCUTANEOUS | Status: AC
  Administered 2021-05-06: 40 mg via SUBCUTANEOUS

## 2021-05-06 NOTE — Progress Notes (Signed)
Patient here today for two week Humira injection for psoriasis. Humira '40mg'$  injected into left lower abdomen. Patient tolerated well. ? ?LOT: [id number] ?EXP: 06/2022 ? ?Patient to return in two weeks for nurse visit. ? ?Johnsie Kindred, RMA ?Documentation: I have reviewed the above documentation for accuracy and completeness, and I agree with the above. ? ?Sarina Ser, MD ? ?

## 2021-05-12 ENCOUNTER — Encounter: Payer: Self-pay | Admitting: Dermatology

## 2021-05-13 NOTE — Progress Notes (Signed)
Patient here today for two week Humira injection for psoriasis. Humira '40mg'$  injected into left lower abdomen. Patient tolerated well. ? ?Patient supplied medication.  ?LOT: [id number] ?EXP: 06/2022 ? ?Patient to return in two weeks for nurse visit. ? ?Johnsie Kindred, RMA ?Documentation: I have reviewed the above documentation for accuracy and completeness, and I agree with the above. ? ?Sarina Ser, MD ? ?

## 2021-05-20 ENCOUNTER — Ambulatory Visit (INDEPENDENT_AMBULATORY_CARE_PROVIDER_SITE_OTHER): Payer: Medicare Other | Admitting: Dermatology

## 2021-05-20 ENCOUNTER — Other Ambulatory Visit: Payer: Self-pay

## 2021-05-20 DIAGNOSIS — L400 Psoriasis vulgaris: Secondary | ICD-10-CM

## 2021-05-20 MED ORDER — ADALIMUMAB 40 MG/0.4ML ~~LOC~~ AJKT
40.0000 mg | AUTO-INJECTOR | Freq: Once | SUBCUTANEOUS | Status: AC
  Administered 2021-05-20: 40 mg via SUBCUTANEOUS

## 2021-05-20 NOTE — Progress Notes (Signed)
Patient here today for two week Humira injection for Psoriasis. Humira '40mg'$ /0.13m injected into right lower abdomen. Patient tolerated well.  ? ?LOT: [id number]?EXP: 12/2021 ? ?AJohnsie Kindred RMA ?Documentation: I have reviewed the above documentation for accuracy and completeness, and I agree with the above. ? ?DSarina Ser MD ? ?

## 2021-05-21 ENCOUNTER — Encounter: Payer: Self-pay | Admitting: Dermatology

## 2021-06-04 ENCOUNTER — Ambulatory Visit (INDEPENDENT_AMBULATORY_CARE_PROVIDER_SITE_OTHER): Payer: Medicare Other | Admitting: Dermatology

## 2021-06-04 DIAGNOSIS — L400 Psoriasis vulgaris: Secondary | ICD-10-CM

## 2021-06-04 MED ORDER — ADALIMUMAB 40 MG/0.4ML ~~LOC~~ AJKT
40.0000 mg | AUTO-INJECTOR | Freq: Once | SUBCUTANEOUS | Status: AC
  Administered 2021-06-04: 40 mg via SUBCUTANEOUS

## 2021-06-04 NOTE — Progress Notes (Signed)
Patient here today for two week Humira injection for psoriasis vulgaris. Humira '40mg'$ /0.38m injected into left lower abdomen. Patient tolerated well. ? ?LOT: [id number]?EXP: 05/2022 ? ?AJohnsie Kindred RMA ?Documentation: I have reviewed the above documentation for accuracy and completeness, and I agree with the above. ? ?DSarina Ser MD ? ?

## 2021-06-12 ENCOUNTER — Encounter: Payer: Self-pay | Admitting: Dermatology

## 2021-06-18 ENCOUNTER — Ambulatory Visit (INDEPENDENT_AMBULATORY_CARE_PROVIDER_SITE_OTHER): Payer: Medicare Other | Admitting: Dermatology

## 2021-06-18 DIAGNOSIS — L400 Psoriasis vulgaris: Secondary | ICD-10-CM

## 2021-06-18 MED ORDER — ADALIMUMAB 40 MG/0.4ML ~~LOC~~ AJKT
40.0000 mg | AUTO-INJECTOR | Freq: Once | SUBCUTANEOUS | Status: AC
  Administered 2021-06-18: 40 mg via SUBCUTANEOUS

## 2021-06-18 NOTE — Progress Notes (Signed)
Patient here today for two week Humira injection for psoriasis vulgaris.  ?Humira '40mg'$ /0.2m injected into right lower abdomen. Patient tolerated well. ? ?Lot: [id number]?EXP: 05/2022 ? ?AJohnsie Kindred RMA ?Documentation: I have reviewed the above documentation for accuracy and completeness, and I agree with the above. ? ?DSarina Ser MD ? ?

## 2021-06-30 ENCOUNTER — Encounter: Payer: Self-pay | Admitting: Dermatology

## 2021-07-02 ENCOUNTER — Ambulatory Visit (INDEPENDENT_AMBULATORY_CARE_PROVIDER_SITE_OTHER): Payer: Medicare Other | Admitting: Dermatology

## 2021-07-02 DIAGNOSIS — L400 Psoriasis vulgaris: Secondary | ICD-10-CM | POA: Diagnosis not present

## 2021-07-02 MED ORDER — ADALIMUMAB 40 MG/0.4ML ~~LOC~~ AJKT
40.0000 mg | AUTO-INJECTOR | Freq: Once | SUBCUTANEOUS | Status: AC
  Administered 2021-07-02: 40 mg via SUBCUTANEOUS

## 2021-07-02 NOTE — Progress Notes (Signed)
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  ? ?Humira '40mg'$ /0.5m injected into left lower abdomen. Patient tolerated well. ? ?LOT: [id number] ?EXP: June 2024 ? ?AJohnsie Kindred RMA ? ?Documentation: I have reviewed the above documentation for accuracy and completeness, and I agree with the above. ? ?TBrendolyn PattyMD  ? ?

## 2021-07-09 ENCOUNTER — Other Ambulatory Visit: Payer: Self-pay

## 2021-07-09 DIAGNOSIS — L409 Psoriasis, unspecified: Secondary | ICD-10-CM

## 2021-07-09 MED ORDER — HUMIRA (2 PEN) 40 MG/0.4ML ~~LOC~~ AJKT: 40.0000 mg | AUTO-INJECTOR | SUBCUTANEOUS | 3 refills | Status: DC

## 2021-07-09 NOTE — Progress Notes (Signed)
Humira RF sent into [email or web address]

## 2021-07-16 ENCOUNTER — Ambulatory Visit (INDEPENDENT_AMBULATORY_CARE_PROVIDER_SITE_OTHER): Payer: Medicare Other | Admitting: Dermatology

## 2021-07-16 DIAGNOSIS — Z79899 Other long term (current) drug therapy: Secondary | ICD-10-CM | POA: Diagnosis not present

## 2021-07-16 DIAGNOSIS — L409 Psoriasis, unspecified: Secondary | ICD-10-CM

## 2021-07-16 MED ORDER — ADALIMUMAB 40 MG/0.4ML ~~LOC~~ AJKT
40.0000 mg | AUTO-INJECTOR | Freq: Once | SUBCUTANEOUS | Status: AC
  Administered 2021-07-16: 40 mg via SUBCUTANEOUS

## 2021-07-16 NOTE — Progress Notes (Signed)
   Follow-Up Visit   Subjective  Brandon Singh is a 66 y.o. male who presents for the following: Follow-up (Patient here today for psoriasis follow up and Humira injection.). Patient accompanied by caregiver who contributes to history.   The following portions of the chart were reviewed this encounter and updated as appropriate:   Tobacco  Allergies  Meds  Problems  Med Hx  Surg Hx  Fam Hx     Review of Systems:  No other skin or systemic complaints except as noted in HPI or Assessment and Plan.  Objective  Well appearing patient in no apparent distress; mood and affect are within normal limits.  A focused examination was performed including trunk, extremities. Relevant physical exam findings are noted in the Assessment and Plan.  trunk, extremities Spotty macular hyperpigmentation in previous psoriasis areas - BSA 0% on treatment    Assessment & Plan  Psoriasis trunk, extremities Improved  Psoriasis - severe on systemic "biologic" treatment injections.  Psoriasis is a chronic non-curable, but treatable genetic/hereditary disease that may have other systemic features affecting other organ systems such as joints (Psoriatic Arthritis).  It is linked with heart disease, inflammatory bowel disease, non-alcoholic fatty liver disease, and depression. Significant skin psoriasis and/or psoriatic arthritis may have significant symptoms and affects activities of daily activity and often benefits from systemic "biologic" injection treatments.  These "biologic" treatments have some potential side effects including immunosuppression and require pre-treatment laboratory screening and periodic laboratory monitoring and periodic in person evaluation and monitoring by the attending dermatologist physician (long term medication management).  Continue Humira 40 mg SQ Q2 wks Humira '40mg'$  injected to right lower abdomen. Patient tolerated well.  Lot # [id number]   Exp: July 2024  Reviewed risks  of biologics including immunosuppression, infections, injection site reaction, and failure to improve condition. Goal is control of skin condition, not cure.  Some older biologics such as Humira and Enbrel may slightly increase risk of malignancy and may worsen congestive heart failure. The use of biologics requires long term medication management, including periodic office visits and monitoring of blood work.  Long term medication management.  Patient is using long term (months to years) prescription medication  to control their dermatologic condition.  These medications require periodic monitoring to evaluate for efficacy and side effects and may require periodic laboratory monitoring.  Related Medications Adalimumab (HUMIRA PEN) 40 MG/0.4ML PNKT Inject 40 mg into the skin every 14 (fourteen) days. For maintenance.  Adalimumab PNKT 40 mg  Patient is due for laboratories will order labs today.  Return in about 4 months (around 11/16/2021) for Psoriasis, 2 weeks with nurse for Humira injection.  Brandon Singh, RMA, am acting as scribe for Sarina Ser, MD . Documentation: I have reviewed the above documentation for accuracy and completeness, and I agree with the above.  Sarina Ser, MD

## 2021-07-16 NOTE — Patient Instructions (Signed)

## 2021-07-20 ENCOUNTER — Encounter: Payer: Self-pay | Admitting: Dermatology

## 2021-07-30 ENCOUNTER — Other Ambulatory Visit: Payer: Self-pay

## 2021-07-30 ENCOUNTER — Ambulatory Visit (INDEPENDENT_AMBULATORY_CARE_PROVIDER_SITE_OTHER): Payer: Medicare Other | Admitting: Dermatology

## 2021-07-30 DIAGNOSIS — L400 Psoriasis vulgaris: Secondary | ICD-10-CM | POA: Diagnosis not present

## 2021-07-30 DIAGNOSIS — L409 Psoriasis, unspecified: Secondary | ICD-10-CM

## 2021-07-30 NOTE — Telephone Encounter (Signed)
error 

## 2021-07-30 NOTE — Progress Notes (Unsigned)
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    Humira '40mg'$ /0.60m injected into left lower abdomen. Patient tolerated well.   LOT: [id number]: 09/2022  ADicie BeamRMA  Documentation: I have reviewed the above documentation for accuracy and completeness, and I agree with the above.  TBrendolyn PattyMD

## 2021-08-13 ENCOUNTER — Ambulatory Visit (INDEPENDENT_AMBULATORY_CARE_PROVIDER_SITE_OTHER): Payer: Medicare Other | Admitting: Dermatology

## 2021-08-13 DIAGNOSIS — L400 Psoriasis vulgaris: Secondary | ICD-10-CM

## 2021-08-13 MED ORDER — ADALIMUMAB 40 MG/0.4ML ~~LOC~~ AJKT
40.0000 mg | AUTO-INJECTOR | Freq: Once | SUBCUTANEOUS | Status: AC
  Administered 2021-08-13: 40 mg via SUBCUTANEOUS

## 2021-08-13 NOTE — Progress Notes (Signed)
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    Humira '40mg'$ /0.80m injected into right lower abdomen. Patient tolerated well.   LOT: [id number]: 09/23/2022   AJohnsie Kindred RMA Documentation: I have reviewed the above documentation for accuracy and completeness, and I agree with the above.  DSarina Ser MD

## 2021-08-27 ENCOUNTER — Ambulatory Visit (INDEPENDENT_AMBULATORY_CARE_PROVIDER_SITE_OTHER): Payer: Medicare Other | Admitting: Dermatology

## 2021-08-27 DIAGNOSIS — L400 Psoriasis vulgaris: Secondary | ICD-10-CM | POA: Diagnosis not present

## 2021-08-27 MED ORDER — ADALIMUMAB 40 MG/0.4ML ~~LOC~~ AJKT
40.0000 mg | AUTO-INJECTOR | Freq: Once | SUBCUTANEOUS | Status: AC
  Administered 2021-08-27: 40 mg via SUBCUTANEOUS

## 2021-08-27 NOTE — Progress Notes (Signed)
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    Humira '40mg'$ /0.79m injected into left lower abdomen. Patient tolerated well.   LOT: [id number]: 10/2022   AJohnsie Kindred RMA  Documentation: I have reviewed the above documentation for accuracy and completeness, and I agree with the above.  TBrendolyn PattyMD

## 2021-09-01 ENCOUNTER — Encounter: Payer: Self-pay | Admitting: Dermatology

## 2021-09-10 ENCOUNTER — Ambulatory Visit (INDEPENDENT_AMBULATORY_CARE_PROVIDER_SITE_OTHER): Payer: Medicare Other | Admitting: Dermatology

## 2021-09-10 DIAGNOSIS — L400 Psoriasis vulgaris: Secondary | ICD-10-CM | POA: Diagnosis not present

## 2021-09-10 MED ORDER — ADALIMUMAB 40 MG/0.4ML ~~LOC~~ AJKT
40.0000 mg | AUTO-INJECTOR | Freq: Once | SUBCUTANEOUS | Status: AC
  Administered 2021-09-10: 40 mg via SUBCUTANEOUS

## 2021-09-10 NOTE — Progress Notes (Signed)
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    Humira '40mg'$ /0.38m injected into right lower abdomen. Patient tolerated well.   LOT: [id number]: 10/2022   Brandon Singh RMA Documentation: I have reviewed the above documentation for accuracy and completeness, and I agree with the above.  DSarina Ser MD

## 2021-09-19 ENCOUNTER — Encounter: Payer: Self-pay | Admitting: Dermatology

## 2021-09-23 ENCOUNTER — Ambulatory Visit: Payer: Medicare Other

## 2021-09-24 ENCOUNTER — Ambulatory Visit: Payer: Medicare Other

## 2021-10-06 ENCOUNTER — Ambulatory Visit (INDEPENDENT_AMBULATORY_CARE_PROVIDER_SITE_OTHER): Payer: Medicare Other | Admitting: Dermatology

## 2021-10-06 DIAGNOSIS — L400 Psoriasis vulgaris: Secondary | ICD-10-CM

## 2021-10-06 MED ORDER — ADALIMUMAB 40 MG/0.4ML ~~LOC~~ AJKT
40.0000 mg | AUTO-INJECTOR | Freq: Once | SUBCUTANEOUS | Status: AC
  Administered 2021-10-06: 40 mg via SUBCUTANEOUS

## 2021-10-06 NOTE — Progress Notes (Signed)
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    Humira '40mg'$ /0.81m injected into left lower abdomen. Patient tolerated well.   LOT: [id number]: 11/2022  ADicie BeamRMA Documentation: I have reviewed the above documentation for accuracy and completeness, and I agree with the above.  DSarina Ser MD

## 2021-10-13 ENCOUNTER — Encounter: Payer: Self-pay | Admitting: Dermatology

## 2021-10-19 ENCOUNTER — Other Ambulatory Visit: Payer: Self-pay

## 2021-10-19 DIAGNOSIS — L409 Psoriasis, unspecified: Secondary | ICD-10-CM

## 2021-10-19 MED ORDER — HUMIRA (2 PEN) 40 MG/0.4ML ~~LOC~~ AJKT: 40.0000 mg | AUTO-INJECTOR | SUBCUTANEOUS | 3 refills | Status: DC

## 2021-10-19 NOTE — Progress Notes (Signed)
Refill request faxed from [facility]  

## 2021-10-20 ENCOUNTER — Encounter: Payer: Self-pay | Admitting: Dermatology

## 2021-10-20 ENCOUNTER — Ambulatory Visit (INDEPENDENT_AMBULATORY_CARE_PROVIDER_SITE_OTHER): Payer: Medicare Other | Admitting: Dermatology

## 2021-10-20 DIAGNOSIS — L400 Psoriasis vulgaris: Secondary | ICD-10-CM

## 2021-10-20 MED ORDER — ADALIMUMAB 40 MG/0.4ML ~~LOC~~ PSKT
40.0000 mg | PREFILLED_SYRINGE | Freq: Once | SUBCUTANEOUS | Status: AC
  Administered 2021-10-20: 40 mg via SUBCUTANEOUS

## 2021-10-20 NOTE — Progress Notes (Signed)
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    Humira '40mg'$ /0.56m injected into right lower abdomen. Patient tolerated well.   LOT: [id number]: 11/2022   ADicie BeamRMA Documentation: I have reviewed the above documentation for accuracy and completeness, and I agree with the above.  DSarina Ser MD

## 2021-10-28 ENCOUNTER — Other Ambulatory Visit: Payer: Self-pay

## 2021-10-28 DIAGNOSIS — L409 Psoriasis, unspecified: Secondary | ICD-10-CM

## 2021-10-28 MED ORDER — HUMIRA (2 PEN) 40 MG/0.4ML ~~LOC~~ AJKT: 40.0000 mg | AUTO-INJECTOR | SUBCUTANEOUS | 3 refills | Status: DC

## 2021-10-28 NOTE — Progress Notes (Signed)
Refill request from [facility]. Per their pharmacy they did not get request that was escripted on 10/19/21. Resent

## 2021-11-03 ENCOUNTER — Ambulatory Visit (INDEPENDENT_AMBULATORY_CARE_PROVIDER_SITE_OTHER): Payer: Medicare Other | Admitting: Dermatology

## 2021-11-03 DIAGNOSIS — L400 Psoriasis vulgaris: Secondary | ICD-10-CM | POA: Diagnosis not present

## 2021-11-03 MED ORDER — ADALIMUMAB 40 MG/0.4ML ~~LOC~~ AJKT
40.0000 mg | AUTO-INJECTOR | Freq: Once | SUBCUTANEOUS | Status: AC
  Administered 2021-11-03: 40 mg via SUBCUTANEOUS

## 2021-11-03 NOTE — Progress Notes (Signed)
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    Humira '40mg'$ /0.19m injected into left lower abdomen. Patient tolerated well.   LOT: [id number]: 11/2022 NBTD:[id number]  ADicie BeamRMA Documentation: I have reviewed the above documentation for accuracy and completeness, and I agree with the above.  DSarina Ser MD

## 2021-11-06 ENCOUNTER — Encounter: Payer: Self-pay | Admitting: Dermatology

## 2021-11-17 ENCOUNTER — Ambulatory Visit: Payer: Medicare Other

## 2021-11-26 ENCOUNTER — Ambulatory Visit (INDEPENDENT_AMBULATORY_CARE_PROVIDER_SITE_OTHER): Payer: Medicare Other | Admitting: Dermatology

## 2021-11-26 DIAGNOSIS — L409 Psoriasis, unspecified: Secondary | ICD-10-CM | POA: Diagnosis not present

## 2021-11-26 DIAGNOSIS — Z79899 Other long term (current) drug therapy: Secondary | ICD-10-CM | POA: Diagnosis not present

## 2021-11-26 DIAGNOSIS — L4050 Arthropathic psoriasis, unspecified: Secondary | ICD-10-CM | POA: Diagnosis not present

## 2021-11-26 NOTE — Patient Instructions (Signed)
Due to recent changes in healthcare laws, you may see results of your pathology and/or laboratory studies on MyChart before the doctors have had a chance to review them. We understand that in some cases there may be results that are confusing or concerning to you. Please understand that not all results are received at the same time and often the doctors may need to interpret multiple results in order to provide you with the best plan of care or course of treatment. Therefore, we ask that you please give us 2 business days to thoroughly review all your results before contacting the office for clarification. Should we see a critical lab result, you will be contacted sooner.   If You Need Anything After Your Visit  If you have any questions or concerns for your doctor, please call our main line at [phone] and press option 4 to reach your doctor's medical assistant. If no one answers, please leave a voicemail as directed and we will return your call as soon as possible. Messages left after 4 pm will be answered the following business day.   You may also send us a message via MyChart. We typically respond to MyChart messages within 1-2 business days.  For prescription refills, please ask your pharmacy to contact our office. Our fax number is [phone].  If you have an urgent issue when the clinic is closed that cannot wait until the next business day, you can page your doctor at the number below.    Please note that while we do our best to be available for urgent issues outside of office hours, we are not available 24/7.   If you have an urgent issue and are unable to reach us, you may choose to seek medical care at your doctor's office, retail clinic, urgent care center, or emergency room.  If you have a medical emergency, please immediately call 911 or go to the emergency department.  Pager Numbers  - Dr. Kowalski: [phone]  - Dr. Moye: [phone]  - Dr. Stewart:  [phone]  In the event of inclement weather, please call our main line at [phone] for an update on the status of any delays or closures.  Dermatology Medication Tips: Please keep the boxes that topical medications come in in order to help keep track of the instructions about where and how to use these. Pharmacies typically print the medication instructions only on the boxes and not directly on the medication tubes.   If your medication is too expensive, please contact our office at [phone] option 4 or send us a message through MyChart.   We are unable to tell what your co-pay for medications will be in advance as this is different depending on your insurance coverage. However, we may be able to find a substitute medication at lower cost or fill out paperwork to get insurance to cover a needed medication.   If a prior authorization is required to get your medication covered by your insurance company, please allow us 1-2 business days to complete this process.  Drug prices often vary depending on where the prescription is filled and some pharmacies may offer cheaper prices.  The website www.goodrx.com contains coupons for medications through different pharmacies. The prices here do not account for what the cost may be with help from insurance (it may be cheaper with your insurance), but the website can give you the price if you did not use any insurance.  - You can print the associated coupon and take it with   your prescription to the pharmacy.  - You may also stop by our office during regular business hours and pick up a GoodRx coupon card.  - If you need your prescription sent electronically to a different pharmacy, notify our office through [place] MyChart or by phone at [phone] option 4.     Si Usted Necesita Algo Despus de Su Visita  Tambin puede enviarnos un mensaje a travs de MyChart. Por lo general respondemos a los mensajes de MyChart en el transcurso de 1 a 2  das hbiles.  Para renovar recetas, por favor pida a su farmacia que se ponga en contacto con nuestra oficina. Nuestro nmero de fax es el [phone].  Si tiene un asunto urgente cuando la clnica est cerrada y que no puede esperar hasta el siguiente da hbil, puede llamar/localizar a su doctor(a) al nmero que aparece a continuacin.   Por favor, tenga en cuenta que aunque hacemos todo lo posible para estar disponibles para asuntos urgentes fuera del horario de oficina, no estamos disponibles las 24 horas del da, los 7 das de la semana.   Si tiene un problema urgente y no puede comunicarse con nosotros, puede optar por buscar atencin mdica  en el consultorio de su doctor(a), en una clnica privada, en un centro de atencin urgente o en una sala de emergencias.  Si tiene una emergencia mdica, por favor llame inmediatamente al 911 o vaya a la sala de emergencias.  Nmeros de bper  - Dr. Kowalski: [phone]  - Dra. Moye: [phone]  - Dra. Stewart: [phone]  En caso de inclemencias del tiempo, por favor llame a nuestra lnea principal al [phone] para una actualizacin sobre el estado de cualquier retraso o cierre.  Consejos para la medicacin en dermatologa: Por favor, guarde las cajas en las que vienen los medicamentos de uso tpico para ayudarle a seguir las instrucciones sobre dnde y cmo usarlos. Las farmacias generalmente imprimen las instrucciones del medicamento slo en las cajas y no directamente en los tubos del medicamento.   Si su medicamento es muy caro, por favor, pngase en contacto con nuestra oficina llamando al [phone] y presione la opcin 4 o envenos un mensaje a travs de MyChart.   No podemos decirle cul ser su copago por los medicamentos por adelantado ya que esto es diferente dependiendo de la cobertura de su seguro. Sin embargo, es posible que podamos encontrar un medicamento sustituto a menor costo o llenar un formulario para que el  seguro cubra el medicamento que se considera necesario.   Si se requiere una autorizacin previa para que su compaa de seguros cubra su medicamento, por favor permtanos de 1 a 2 das hbiles para completar este proceso.  Los precios de los medicamentos varan con frecuencia dependiendo del lugar de dnde se surte la receta y alguna farmacias pueden ofrecer precios ms baratos.  El sitio web www.goodrx.com tiene cupones para medicamentos de diferentes farmacias. Los precios aqu no tienen en cuenta lo que podra costar con la ayuda del seguro (puede ser ms barato con su seguro), pero el sitio web puede darle el precio si no utiliz ningn seguro.  - Puede imprimir el cupn correspondiente y llevarlo con su receta a la farmacia.  - Tambin puede pasar por nuestra oficina durante el horario de atencin regular y recoger una tarjeta de cupones de GoodRx.  - Si necesita que su receta se enve electrnicamente a una farmacia diferente, informe a nuestra oficina a travs de MyChart de [place]   o por telfono llamando al [phone] y presione la opcin 4.  

## 2021-11-26 NOTE — Progress Notes (Signed)
   Follow-Up Visit   Subjective  Brandon Singh is a 66 y.o. male who presents for the following: Psoriasis with Psoriatic Arthritis (Trunk, extremities, 73mf/u Humira '40mg'$ /0.411msq injections q 2 wks, moisturizer).  Patient accompanied by caregiver.  The following portions of the chart were reviewed this encounter and updated as appropriate:   Tobacco  Allergies  Meds  Problems  Med Hx  Surg Hx  Fam Hx     Review of Systems:  No other skin or systemic complaints except as noted in HPI or Assessment and Plan.  Objective  Well appearing patient in no apparent distress; mood and affect are within normal limits.  A focused examination was performed including face, arms, trunk. Relevant physical exam findings are noted in the Assessment and Plan.  Chest - Medial ([place]) Scalp, skin clear today   Assessment & Plan  [facility]oriasis [facility] Doing very well currently.  No side effects. Psoriasis - severe on systemic "biologic" treatment injections.  Psoriasis is a chronic non-curable, but treatable genetic/hereditary disease that may have other systemic features affecting other organ systems such as joints (Psoriatic Arthritis).  It is linked with heart disease, inflammatory bowel disease, non-alcoholic fatty liver disease, and depression. Significant skin psoriasis and/or psoriatic arthritis may have significant symptoms and affects activities of daily activity and often benefits from systemic "biologic" injection treatments.  These "biologic" treatments have some potential side effects including immunosuppression and require pre-treatment laboratory screening and periodic laboratory monitoring and periodic in person evaluation and monitoring by the attending dermatologist physician (long term medication management).   Cont Humira '40mg'$ /0.55m5mq injections q 2 wks Humira '40mg'$ /0.55ml10m injection today to R lower abdomen, Lot [id number]  11/2022  Related Medications Adalimumab (HUMIRA PEN) 40 MG/0.4ML PNKT Inject 40 mg into the skin every 14 (fourteen) days. For maintenance.  Return in about 6 months (around 05/28/2022) for Psoriasis f/u.  I, SonyOthelia PullingA, am acting as scribe for DaviSarina Ser . Documentation: I have reviewed the above documentation for accuracy and completeness, and I agree with the above.  DaviSarina Ser

## 2021-12-01 ENCOUNTER — Encounter: Payer: Self-pay | Admitting: Dermatology

## 2021-12-10 ENCOUNTER — Ambulatory Visit (INDEPENDENT_AMBULATORY_CARE_PROVIDER_SITE_OTHER): Payer: Medicare Other | Admitting: Dermatology

## 2021-12-10 DIAGNOSIS — L409 Psoriasis, unspecified: Secondary | ICD-10-CM

## 2021-12-10 MED ORDER — ADALIMUMAB 40 MG/0.4ML ~~LOC~~ AJKT
40.0000 mg | AUTO-INJECTOR | Freq: Once | SUBCUTANEOUS | Status: AC
  Administered 2021-12-10: 40 mg via SUBCUTANEOUS

## 2021-12-10 NOTE — Patient Instructions (Signed)
Due to recent changes in healthcare laws, you may see results of your pathology and/or laboratory studies on MyChart before the doctors have had a chance to review them. We understand that in some cases there may be results that are confusing or concerning to you. Please understand that not all results are received at the same time and often the doctors may need to interpret multiple results in order to provide you with the best plan of care or course of treatment. Therefore, we ask that you please give us 2 business days to thoroughly review all your results before contacting the office for clarification. Should we see a critical lab result, you will be contacted sooner.   If You Need Anything After Your Visit  If you have any questions or concerns for your doctor, please call our main line at [phone] and press option 4 to reach your doctor's medical assistant. If no one answers, please leave a voicemail as directed and we will return your call as soon as possible. Messages left after 4 pm will be answered the following business day.   You may also send us a message via MyChart. We typically respond to MyChart messages within 1-2 business days.  For prescription refills, please ask your pharmacy to contact our office. Our fax number is [phone].  If you have an urgent issue when the clinic is closed that cannot wait until the next business day, you can page your doctor at the number below.    Please note that while we do our best to be available for urgent issues outside of office hours, we are not available 24/7.   If you have an urgent issue and are unable to reach us, you may choose to seek medical care at your doctor's office, retail clinic, urgent care center, or emergency room.  If you have a medical emergency, please immediately call 911 or go to the emergency department.  Pager Numbers  - Dr. Kowalski: [phone]  - Dr. Moye: [phone]  - Dr. Stewart:  [phone]  In the event of inclement weather, please call our main line at [phone] for an update on the status of any delays or closures.  Dermatology Medication Tips: Please keep the boxes that topical medications come in in order to help keep track of the instructions about where and how to use these. Pharmacies typically print the medication instructions only on the boxes and not directly on the medication tubes.   If your medication is too expensive, please contact our office at [phone] option 4 or send us a message through MyChart.   We are unable to tell what your co-pay for medications will be in advance as this is different depending on your insurance coverage. However, we may be able to find a substitute medication at lower cost or fill out paperwork to get insurance to cover a needed medication.   If a prior authorization is required to get your medication covered by your insurance company, please allow us 1-2 business days to complete this process.  Drug prices often vary depending on where the prescription is filled and some pharmacies may offer cheaper prices.  The website www.goodrx.com contains coupons for medications through different pharmacies. The prices here do not account for what the cost may be with help from insurance (it may be cheaper with your insurance), but the website can give you the price if you did not use any insurance.  - You can print the associated coupon and take it with   your prescription to the pharmacy.  - You may also stop by our office during regular business hours and pick up a GoodRx coupon card.  - If you need your prescription sent electronically to a different pharmacy, notify our office through [place] MyChart or by phone at [phone] option 4.     Si Usted Necesita Algo Despus de Su Visita  Tambin puede enviarnos un mensaje a travs de MyChart. Por lo general respondemos a los mensajes de MyChart en el transcurso de 1 a 2  das hbiles.  Para renovar recetas, por favor pida a su farmacia que se ponga en contacto con nuestra oficina. Nuestro nmero de fax es el [phone].  Si tiene un asunto urgente cuando la clnica est cerrada y que no puede esperar hasta el siguiente da hbil, puede llamar/localizar a su doctor(a) al nmero que aparece a continuacin.   Por favor, tenga en cuenta que aunque hacemos todo lo posible para estar disponibles para asuntos urgentes fuera del horario de oficina, no estamos disponibles las 24 horas del da, los 7 das de la semana.   Si tiene un problema urgente y no puede comunicarse con nosotros, puede optar por buscar atencin mdica  en el consultorio de su doctor(a), en una clnica privada, en un centro de atencin urgente o en una sala de emergencias.  Si tiene una emergencia mdica, por favor llame inmediatamente al 911 o vaya a la sala de emergencias.  Nmeros de bper  - Dr. Kowalski: [phone]  - Dra. Moye: [phone]  - Dra. Stewart: [phone]  En caso de inclemencias del tiempo, por favor llame a nuestra lnea principal al [phone] para una actualizacin sobre el estado de cualquier retraso o cierre.  Consejos para la medicacin en dermatologa: Por favor, guarde las cajas en las que vienen los medicamentos de uso tpico para ayudarle a seguir las instrucciones sobre dnde y cmo usarlos. Las farmacias generalmente imprimen las instrucciones del medicamento slo en las cajas y no directamente en los tubos del medicamento.   Si su medicamento es muy caro, por favor, pngase en contacto con nuestra oficina llamando al [phone] y presione la opcin 4 o envenos un mensaje a travs de MyChart.   No podemos decirle cul ser su copago por los medicamentos por adelantado ya que esto es diferente dependiendo de la cobertura de su seguro. Sin embargo, es posible que podamos encontrar un medicamento sustituto a menor costo o llenar un formulario para que el  seguro cubra el medicamento que se considera necesario.   Si se requiere una autorizacin previa para que su compaa de seguros cubra su medicamento, por favor permtanos de 1 a 2 das hbiles para completar este proceso.  Los precios de los medicamentos varan con frecuencia dependiendo del lugar de dnde se surte la receta y alguna farmacias pueden ofrecer precios ms baratos.  El sitio web www.goodrx.com tiene cupones para medicamentos de diferentes farmacias. Los precios aqu no tienen en cuenta lo que podra costar con la ayuda del seguro (puede ser ms barato con su seguro), pero el sitio web puede darle el precio si no utiliz ningn seguro.  - Puede imprimir el cupn correspondiente y llevarlo con su receta a la farmacia.  - Tambin puede pasar por nuestra oficina durante el horario de atencin regular y recoger una tarjeta de cupones de GoodRx.  - Si necesita que su receta se enve electrnicamente a una farmacia diferente, informe a nuestra oficina a travs de MyChart de [place]   o por telfono llamando al [phone] y presione la opcin 4.  

## 2021-12-10 NOTE — Progress Notes (Signed)
   Follow-Up Visit   Subjective  Brandon Singh is a 66 y.o. male who presents for the following: Psoriasis (Patient here today for Humira injection).  The following portions of the chart were reviewed this encounter and updated as appropriate:   Tobacco  Allergies  Meds  Problems  Med Hx  Surg Hx  Fam Hx     Review of Systems:  No other skin or systemic complaints except as noted in HPI or Assessment and Plan.  Objective  Well appearing patient in no apparent distress; mood and affect are within normal limits.  A focused examination was performed including the face and abdomen. Relevant physical exam findings are noted in the Assessment and Plan.   Assessment & Plan  Psoriasis Scalp, face, trunk, extremities  Psoriasis is a chronic non-curable, but treatable genetic/hereditary disease that may have other systemic features affecting other organ systems such as joints (Psoriatic Arthritis). It is associated with an increased risk of inflammatory bowel disease, heart disease, non-alcoholic fatty liver disease, and depression.    Continue Humira '40mg'$ /0.110m SQ QOW.   Humira '40mg'$ /0.425minjected SQ into the LLQA. Patient tolerated injection well. AL, CMA  Adalimumab PNKT 40 mg - Scalp, face, trunk, extremities   Related Medications Adalimumab (HUMIRA PEN) 40 MG/0.4ML PNKT Inject 40 mg into the skin every 14 (fourteen) days. For maintenance.   Return in about 2 weeks (around 12/24/2021) for Humira injection with nurse.  I,Luther RedoCMA, am acting as scribe for DaSarina SerMD . Documentation: I have reviewed the above documentation for accuracy and completeness, and I agree with the above.  DaSarina SerMD

## 2021-12-20 ENCOUNTER — Encounter: Payer: Self-pay | Admitting: Dermatology

## 2021-12-24 ENCOUNTER — Ambulatory Visit (INDEPENDENT_AMBULATORY_CARE_PROVIDER_SITE_OTHER): Payer: Medicare Other | Admitting: Dermatology

## 2021-12-24 DIAGNOSIS — L400 Psoriasis vulgaris: Secondary | ICD-10-CM | POA: Diagnosis not present

## 2021-12-24 MED ORDER — ADALIMUMAB 40 MG/0.4ML ~~LOC~~ AJKT
40.0000 mg | AUTO-INJECTOR | Freq: Once | SUBCUTANEOUS | Status: AC
  Administered 2021-12-24: 40 mg via SUBCUTANEOUS

## 2021-12-24 NOTE — Progress Notes (Signed)
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    Humira '40mg'$ /0.57m injected into right lower abdomen. Patient tolerated well.   LOT: [id number]: 05/2023 [id number]  ADicie BeamRMA Documentation: I have reviewed the above documentation for accuracy and completeness, and I agree with the above.  DSarina Ser MD

## 2022-01-07 ENCOUNTER — Ambulatory Visit (INDEPENDENT_AMBULATORY_CARE_PROVIDER_SITE_OTHER): Payer: Medicare Other | Admitting: Dermatology

## 2022-01-07 DIAGNOSIS — L400 Psoriasis vulgaris: Secondary | ICD-10-CM | POA: Diagnosis not present

## 2022-01-07 MED ORDER — ADALIMUMAB 40 MG/0.4ML ~~LOC~~ AJKT
40.0000 mg | AUTO-INJECTOR | Freq: Once | SUBCUTANEOUS | Status: AC
  Administered 2022-01-07: 40 mg via SUBCUTANEOUS

## 2022-01-07 NOTE — Progress Notes (Signed)
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    Humira '40mg'$ /0.36m injected into left lower abdomen. Patient tolerated well.   LOT: [id number]: 02/2023 [id number] AJohnsie Kindred RMA Documentation: I have reviewed the above documentation for accuracy and completeness, and I agree with the above.  DSarina Ser MD

## 2022-01-08 ENCOUNTER — Encounter: Payer: Self-pay | Admitting: Dermatology

## 2022-01-21 ENCOUNTER — Ambulatory Visit (INDEPENDENT_AMBULATORY_CARE_PROVIDER_SITE_OTHER): Payer: Medicare Other | Admitting: Dermatology

## 2022-01-21 DIAGNOSIS — L400 Psoriasis vulgaris: Secondary | ICD-10-CM

## 2022-01-21 MED ORDER — ADALIMUMAB 40 MG/0.4ML ~~LOC~~ AJKT
40.0000 mg | AUTO-INJECTOR | Freq: Once | SUBCUTANEOUS | Status: AC
  Administered 2022-01-21: 40 mg via SUBCUTANEOUS

## 2022-01-21 NOTE — Progress Notes (Signed)
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    Humira '40mg'$ /0.44m injected into right lower abdomen. Patient tolerated well.   LOT: [id number]: 02/2023 [id number]  AJohnsie Kindred RMA Documentation: I have reviewed the above documentation for accuracy and completeness, and I agree with the above.  DSarina Ser MD

## 2022-01-26 ENCOUNTER — Encounter: Payer: Self-pay | Admitting: Dermatology

## 2022-02-04 ENCOUNTER — Ambulatory Visit (INDEPENDENT_AMBULATORY_CARE_PROVIDER_SITE_OTHER): Payer: Medicare Other

## 2022-02-04 DIAGNOSIS — L400 Psoriasis vulgaris: Secondary | ICD-10-CM | POA: Diagnosis not present

## 2022-02-04 MED ORDER — ADALIMUMAB 40 MG/0.4ML ~~LOC~~ AJKT
40.0000 mg | AUTO-INJECTOR | Freq: Once | SUBCUTANEOUS | Status: AC
  Administered 2022-02-04: 40 mg via SUBCUTANEOUS

## 2022-02-04 NOTE — Progress Notes (Signed)
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    Humira '40mg'$ /0.75m injected into left lower abdomen. Patient tolerated well.   LOT: [id number]: 05/2023 [id number] ADicie BeamRMA

## 2022-02-06 ENCOUNTER — Encounter: Payer: Self-pay | Admitting: Dermatology

## 2022-02-18 ENCOUNTER — Ambulatory Visit (INDEPENDENT_AMBULATORY_CARE_PROVIDER_SITE_OTHER): Payer: Medicare Other

## 2022-02-18 DIAGNOSIS — L400 Psoriasis vulgaris: Secondary | ICD-10-CM | POA: Diagnosis not present

## 2022-02-18 MED ORDER — ADALIMUMAB 40 MG/0.4ML ~~LOC~~ AJKT
40.0000 mg | AUTO-INJECTOR | Freq: Once | SUBCUTANEOUS | Status: AC
  Administered 2022-02-18: 40 mg via SUBCUTANEOUS

## 2022-02-18 NOTE — Progress Notes (Signed)
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    Humira '40mg'$ /0.53m injected into right lower abdomen. Patient tolerated well.   LOT: [id number]: 05/2023 [id number]  AJohnsie Kindred RMA

## 2022-03-04 ENCOUNTER — Ambulatory Visit (INDEPENDENT_AMBULATORY_CARE_PROVIDER_SITE_OTHER): Payer: Medicare HMO

## 2022-03-04 DIAGNOSIS — L400 Psoriasis vulgaris: Secondary | ICD-10-CM

## 2022-03-04 MED ORDER — ADALIMUMAB 40 MG/0.4ML ~~LOC~~ AJKT
40.0000 mg | AUTO-INJECTOR | Freq: Once | SUBCUTANEOUS | Status: AC
  Administered 2022-03-04: 40 mg via SUBCUTANEOUS

## 2022-03-04 NOTE — Progress Notes (Signed)
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    Humira '40mg'$ /0.46m injected into left lower abdomen. Patient tolerated well.   LOT:: [id number]: 07/2023 [id number] Brandon Singh RMA

## 2022-03-09 ENCOUNTER — Other Ambulatory Visit: Payer: Self-pay

## 2022-03-09 DIAGNOSIS — L409 Psoriasis, unspecified: Secondary | ICD-10-CM

## 2022-03-09 MED ORDER — HUMIRA (2 PEN) 40 MG/0.4ML ~~LOC~~ AJKT: 40.0000 mg | AUTO-INJECTOR | SUBCUTANEOUS | 3 refills | Status: DC

## 2022-03-18 ENCOUNTER — Ambulatory Visit: Payer: Medicare HMO

## 2022-03-30 ENCOUNTER — Ambulatory Visit (INDEPENDENT_AMBULATORY_CARE_PROVIDER_SITE_OTHER): Payer: Medicare HMO

## 2022-03-30 DIAGNOSIS — L400 Psoriasis vulgaris: Secondary | ICD-10-CM | POA: Diagnosis not present

## 2022-03-30 MED ORDER — ADALIMUMAB 40 MG/0.4ML ~~LOC~~ AJKT
40.0000 mg | AUTO-INJECTOR | Freq: Once | SUBCUTANEOUS | Status: AC
  Administered 2022-03-30: 40 mg via SUBCUTANEOUS

## 2022-03-30 NOTE — Progress Notes (Signed)
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    Humira '40mg'$ /0.73m injected into right lower abdomen. Patient tolerated well.   LOT: [id number]: 07/2023 [id number]  AJohnsie KindredRMA

## 2022-04-13 ENCOUNTER — Ambulatory Visit (INDEPENDENT_AMBULATORY_CARE_PROVIDER_SITE_OTHER): Payer: Medicare HMO

## 2022-04-13 DIAGNOSIS — L400 Psoriasis vulgaris: Secondary | ICD-10-CM

## 2022-04-13 MED ORDER — ADALIMUMAB 40 MG/0.4ML ~~LOC~~ AJKT
40.0000 mg | AUTO-INJECTOR | Freq: Once | SUBCUTANEOUS | Status: AC
  Administered 2022-04-13: 40 mg via SUBCUTANEOUS

## 2022-04-13 NOTE — Progress Notes (Signed)
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    Humira 80m/0.4mL injected into left lower abdomen. Patient tolerated well.   LOT: [id number]: 09/2023 [id number]  AJohnsie KindredRMA

## 2022-04-27 ENCOUNTER — Ambulatory Visit (INDEPENDENT_AMBULATORY_CARE_PROVIDER_SITE_OTHER): Payer: Medicare HMO

## 2022-04-27 DIAGNOSIS — L400 Psoriasis vulgaris: Secondary | ICD-10-CM

## 2022-04-27 MED ORDER — ADALIMUMAB 40 MG/0.4ML ~~LOC~~ AJKT
40.0000 mg | AUTO-INJECTOR | Freq: Once | SUBCUTANEOUS | Status: AC
  Administered 2022-04-27: 40 mg via SUBCUTANEOUS

## 2022-04-27 NOTE — Progress Notes (Signed)
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    Humira '40mg'$ /0.36m injected into right lower abdomen. Patient tolerated well.   LOT: [id number]: 09/2023 [id number]  AJohnsie KindredRMA

## 2022-05-11 ENCOUNTER — Ambulatory Visit: Payer: Medicare HMO

## 2022-05-13 ENCOUNTER — Ambulatory Visit (INDEPENDENT_AMBULATORY_CARE_PROVIDER_SITE_OTHER): Payer: Medicare HMO

## 2022-05-13 DIAGNOSIS — L400 Psoriasis vulgaris: Secondary | ICD-10-CM | POA: Diagnosis not present

## 2022-05-13 MED ORDER — ADALIMUMAB 40 MG/0.4ML ~~LOC~~ AJKT
40.0000 mg | AUTO-INJECTOR | Freq: Once | SUBCUTANEOUS | Status: AC
  Administered 2022-05-13: 40 mg via SUBCUTANEOUS

## 2022-05-13 NOTE — Progress Notes (Signed)
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    Humira 40mg/0.4mL injected into left lower abdomen. Patient tolerated well.   LOT: [id number] EXP: 09/2023 [id number]   Dionta Larke RMA         

## 2022-05-25 ENCOUNTER — Encounter: Payer: Self-pay | Admitting: Gastroenterology

## 2022-05-25 ENCOUNTER — Ambulatory Visit: Payer: Medicare Other | Admitting: Gastroenterology

## 2022-05-25 NOTE — Progress Notes (Deleted)
Jonathon Bellows MD, MRCP(U.K) [address]  [place]  [place], [place] [address]  Main: [phone]  Fax: [phone]   Gastroenterology Consultation  Referring Provider:     Elba Barman, MD Primary Care Physician:  Elba Barman, MD Primary Gastroenterologist:  Dr. Jonathon Bellows  Reason for Consultation:     Lower GI bleed        HPI:   Brandon Singh is a 67 y.o. y/o male referred for consultation & management  by Dr. Loma Newton, Honor Loh, MD.      No recent labs .   Past Medical History:  Diagnosis Date   Dementia ([place])    Hypertension    Hypothyroidism    Mental retardation    Schizophrenia ([place])    Sleep apnea     Past Surgical History:  Procedure Laterality Date   COLONOSCOPY WITH PROPOFOL N/A 04/11/2015   Procedure: COLONOSCOPY WITH PROPOFOL;  Surgeon: Josefine Class, MD;  Location: [facility] ENDOSCOPY;  Service: Endoscopy;  Laterality: N/A;   ESOPHAGOGASTRODUODENOSCOPY (EGD) WITH PROPOFOL N/A 05/05/2015   Procedure: ESOPHAGOGASTRODUODENOSCOPY (EGD) WITH PROPOFOL;  Surgeon: Josefine Class, MD;  Location: [facility] ENDOSCOPY;  Service: Endoscopy;  Laterality: N/A;    Prior to Admission medications   Medication Sig Start Date End Date Taking? Authorizing Provider  Adalimumab (HUMIRA, 2 PEN,) 40 MG/0.4ML PNKT Inject 40 mg into the skin every 14 (fourteen) days. For maintenance. 03/09/22   Ralene Bathe, MD  ferrous sulfate 325 (65 FE) MG tablet Take 1 tablet (325 mg total) by mouth 2 (two) times daily with a meal. 03/24/15   Hillary Bow, MD  fluPHENAZine (PROLIXIN) 10 MG tablet Take 10-15 mg by mouth 2 (two) times daily.     [provider]  levothyroxine (SYNTHROID, LEVOTHROID) 112 MCG tablet Take by mouth.    [provider]  pantoprazole (PROTONIX) 40 MG tablet Take 1 tablet (40 mg total) by mouth 2 (two) times daily. Patient taking differently: Take 40 mg by mouth daily. 08/29/15   Gladstone Lighter, MD  QUEtiapine (SEROQUEL) 100  MG tablet Take 200 mg by mouth daily.     [provider]  QUEtiapine (SEROQUEL) 400 MG tablet Take 400 mg by mouth at bedtime.    [provider]  sertraline (ZOLOFT) 100 MG tablet Take 150 mg by mouth daily.    [provider]  tamsulosin (FLOMAX) 0.4 MG CAPS capsule Take 0.4 mg by mouth.    [provider]  traZODone (DESYREL) 100 MG tablet Take 200 mg by mouth at bedtime.    [provider]    Family History  Problem Relation Age of Onset   Hypertension Other      Social History   Tobacco Use   Smoking status: Every Day    Packs/day: .5    Types: Cigarettes   Smokeless tobacco: Never  Substance Use Topics   Alcohol use: No   Drug use: No    Allergies as of 05/25/2022   (No Known Allergies)    Review of Systems:    All systems reviewed and negative except where noted in HPI.   Physical Exam:  There were no vitals taken for this visit. No LMP for male patient. Psych:  Alert and cooperative. Normal mood and affect. General:   Alert,  Well-developed, well-nourished, pleasant and cooperative in NAD Head:  Normocephalic and atraumatic. Eyes:  Sclera clear, no icterus.   Conjunctiva pink. Ears:  Normal auditory acuity. Neck:  Supple; no masses or thyromegaly. Lungs:  Respirations even and unlabored.  Clear throughout to auscultation.   No wheezes, crackles, or rhonchi. No acute distress. Heart:  Regular rate and rhythm; no murmurs, clicks, rubs, or gallops. Abdomen:  Normal bowel sounds.  No bruits.  Soft, non-tender and non-distended without masses, hepatosplenomegaly or hernias noted.  No guarding or rebound tenderness.    Neurologic:  Alert and oriented x3;  grossly normal neurologically. Psych:  Alert and cooperative. Normal mood and affect.  Imaging Studies: No results found.  Assessment and Plan:   Brandon Singh is a 67 y.o. y/o male has been referred for ***  Follow up in ***  Dr Jonathon Bellows  MD,MRCP(U.K)    BP check ***

## 2022-05-27 ENCOUNTER — Ambulatory Visit: Payer: Medicare HMO

## 2022-05-31 ENCOUNTER — Ambulatory Visit (INDEPENDENT_AMBULATORY_CARE_PROVIDER_SITE_OTHER): Payer: Medicare HMO | Admitting: Dermatology

## 2022-05-31 VITALS — BP 114/72

## 2022-05-31 DIAGNOSIS — L409 Psoriasis, unspecified: Secondary | ICD-10-CM

## 2022-05-31 DIAGNOSIS — Z79899 Other long term (current) drug therapy: Secondary | ICD-10-CM | POA: Diagnosis not present

## 2022-05-31 DIAGNOSIS — L819 Disorder of pigmentation, unspecified: Secondary | ICD-10-CM | POA: Diagnosis not present

## 2022-05-31 MED ORDER — ADALIMUMAB 40 MG/0.4ML ~~LOC~~ AJKT
40.0000 mg | AUTO-INJECTOR | Freq: Once | SUBCUTANEOUS | Status: AC
  Administered 2022-05-31: 40 mg via SUBCUTANEOUS

## 2022-05-31 NOTE — Progress Notes (Signed)
   Follow-Up Visit   Subjective  Brandon Singh is a 67 y.o. male who presents for the following: Psoriasis   The following portions of the chart were reviewed this encounter and updated as appropriate: medications, allergies, medical history  Review of Systems:  No other skin or systemic complaints except as noted in HPI or Assessment and Plan.  Objective  Well appearing patient in no apparent distress; mood and affect are within normal limits.  Areas Examined: Trunk and extremities and head Minimal macular spotty hyperpigmentation and xerosis  1% BSA on treatment Relevant exam findings are noted in the Assessment and Plan.     Assessment & Plan   Psoriasis With dyschromia -but improved on Humira for several years  Related Medications Adalimumab (HUMIRA, 2 PEN,) 40 MG/0.4ML PNKT Inject 40 mg into the skin every 14 (fourteen) days. For maintenance.  Treatment Plan: Cont Humira  sq injections q 2 wks Humira  sq injection today to R abdomen, lot [id number] exp09/2025 Plan labs on f/u  Counseling and coordination of care for severe psoriasis on systemic treatment  psoriasis - severe on systemic treatment.  Psoriasis is a chronic non-curable, but treatable genetic/hereditary disease that may have other systemic features affecting other organ systems such as joints (Psoriatic Arthritis).  It is linked with heart disease, inflammatory bowel disease, non-alcoholic fatty liver disease, and depression. Significant skin psoriasis and/or psoriatic arthritis may have significant symptoms and affects activities of daily activity and often benefits from systemic treatments.  These systemic treatments have some potential side effects including immunosuppression and require pre-treatment laboratory screening and periodic laboratory monitoring and periodic in person evaluation and monitoring by the attending dermatologist physician (long term medication management).   Return in about  2 weeks (around 06/14/2022) for nurse, 53m Dr. Kirtland Bouchard , Psoriasis f/u.  I, Ardis Rowan, RMA, am acting as scribe for Armida Sans, MD .  Documentation: I have reviewed the above documentation for accuracy and completeness, and I agree with the above.  Armida Sans, MD

## 2022-05-31 NOTE — Patient Instructions (Signed)
Due to recent changes in healthcare laws, you may see results of your pathology and/or laboratory studies on MyChart before the doctors have had a chance to review them. We understand that in some cases there may be results that are confusing or concerning to you. Please understand that not all results are received at the same time and often the doctors may need to interpret multiple results in order to provide you with the best plan of care or course of treatment. Therefore, we ask that you please give us 2 business days to thoroughly review all your results before contacting the office for clarification. Should we see a critical lab result, you will be contacted sooner.   If You Need Anything After Your Visit  If you have any questions or concerns for your doctor, please call our main line at [phone] and press option 4 to reach your doctor's medical assistant. If no one answers, please leave a voicemail as directed and we will return your call as soon as possible. Messages left after 4 pm will be answered the following business day.   You may also send us a message via MyChart. We typically respond to MyChart messages within 1-2 business days.  For prescription refills, please ask your pharmacy to contact our office. Our fax number is [phone].  If you have an urgent issue when the clinic is closed that cannot wait until the next business day, you can page your doctor at the number below.    Please note that while we do our best to be available for urgent issues outside of office hours, we are not available 24/7.   If you have an urgent issue and are unable to reach us, you may choose to seek medical care at your doctor's office, retail clinic, urgent care center, or emergency room.  If you have a medical emergency, please immediately call 911 or go to the emergency department.  Pager Numbers  - Dr. Kowalski: [phone]  - Dr. Moye: [phone]  - Dr. Stewart:  [phone]  In the event of inclement weather, please call our main line at [phone] for an update on the status of any delays or closures.  Dermatology Medication Tips: Please keep the boxes that topical medications come in in order to help keep track of the instructions about where and how to use these. Pharmacies typically print the medication instructions only on the boxes and not directly on the medication tubes.   If your medication is too expensive, please contact our office at [phone] option 4 or send us a message through MyChart.   We are unable to tell what your co-pay for medications will be in advance as this is different depending on your insurance coverage. However, we may be able to find a substitute medication at lower cost or fill out paperwork to get insurance to cover a needed medication.   If a prior authorization is required to get your medication covered by your insurance company, please allow us 1-2 business days to complete this process.  Drug prices often vary depending on where the prescription is filled and some pharmacies may offer cheaper prices.  The website www.goodrx.com contains coupons for medications through different pharmacies. The prices here do not account for what the cost may be with help from insurance (it may be cheaper with your insurance), but the website can give you the price if you did not use any insurance.  - You can print the associated coupon and take it with   your prescription to the pharmacy.  - You may also stop by our office during regular business hours and pick up a GoodRx coupon card.  - If you need your prescription sent electronically to a different pharmacy, notify our office through [facility] MyChart or by phone at [phone] option 4.     Si Usted Necesita Algo Despus de Su Visita  Tambin puede enviarnos un mensaje a travs de MyChart. Por lo general respondemos a los mensajes de MyChart en el transcurso de 1 a 2  das hbiles.  Para renovar recetas, por favor pida a su farmacia que se ponga en contacto con nuestra oficina. Nuestro nmero de fax es el [phone].  Si tiene un asunto urgente cuando la clnica est cerrada y que no puede esperar hasta el siguiente da hbil, puede llamar/localizar a su doctor(a) al nmero que aparece a continuacin.   Por favor, tenga en cuenta que aunque hacemos todo lo posible para estar disponibles para asuntos urgentes fuera del horario de oficina, no estamos disponibles las 24 horas del da, los 7 das de la semana.   Si tiene un problema urgente y no puede comunicarse con nosotros, puede optar por buscar atencin mdica  en el consultorio de su doctor(a), en una clnica privada, en un centro de atencin urgente o en una sala de emergencias.  Si tiene una emergencia mdica, por favor llame inmediatamente al 911 o vaya a la sala de emergencias.  Nmeros de bper  - Dr. Kowalski: [phone]  - Dra. Moye: [phone]  - Dra. Stewart: [phone]  En caso de inclemencias del tiempo, por favor llame a nuestra lnea principal al [phone] para una actualizacin sobre el estado de cualquier retraso o cierre.  Consejos para la medicacin en dermatologa: Por favor, guarde las cajas en las que vienen los medicamentos de uso tpico para ayudarle a seguir las instrucciones sobre dnde y cmo usarlos. Las farmacias generalmente imprimen las instrucciones del medicamento slo en las cajas y no directamente en los tubos del medicamento.   Si su medicamento es muy caro, por favor, pngase en contacto con nuestra oficina llamando al [phone] y presione la opcin 4 o envenos un mensaje a travs de MyChart.   No podemos decirle cul ser su copago por los medicamentos por adelantado ya que esto es diferente dependiendo de la cobertura de su seguro. Sin embargo, es posible que podamos encontrar un medicamento sustituto a menor costo o llenar un formulario para que el  seguro cubra el medicamento que se considera necesario.   Si se requiere una autorizacin previa para que su compaa de seguros cubra su medicamento, por favor permtanos de 1 a 2 das hbiles para completar este proceso.  Los precios de los medicamentos varan con frecuencia dependiendo del lugar de dnde se surte la receta y alguna farmacias pueden ofrecer precios ms baratos.  El sitio web www.goodrx.com tiene cupones para medicamentos de diferentes farmacias. Los precios aqu no tienen en cuenta lo que podra costar con la ayuda del seguro (puede ser ms barato con su seguro), pero el sitio web puede darle el precio si no utiliz ningn seguro.  - Puede imprimir el cupn correspondiente y llevarlo con su receta a la farmacia.  - Tambin puede pasar por nuestra oficina durante el horario de atencin regular y recoger una tarjeta de cupones de GoodRx.  - Si necesita que su receta se enve electrnicamente a una farmacia diferente, informe a nuestra oficina a travs de MyChart de [facility]   o por telfono llamando al [phone] y presione la opcin 4.  

## 2022-06-10 ENCOUNTER — Ambulatory Visit: Payer: Medicare Other | Admitting: Dermatology

## 2022-06-14 ENCOUNTER — Ambulatory Visit (INDEPENDENT_AMBULATORY_CARE_PROVIDER_SITE_OTHER): Payer: Medicare HMO

## 2022-06-14 ENCOUNTER — Encounter: Payer: Self-pay | Admitting: Dermatology

## 2022-06-14 DIAGNOSIS — L400 Psoriasis vulgaris: Secondary | ICD-10-CM

## 2022-06-14 MED ORDER — ADALIMUMAB 40 MG/0.4ML ~~LOC~~ AJKT
40.0000 mg | AUTO-INJECTOR | Freq: Once | SUBCUTANEOUS | Status: AC
  Administered 2022-06-14: 40 mg via SUBCUTANEOUS

## 2022-06-14 NOTE — Progress Notes (Signed)
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    Humira /0.30mL injected into left lower abdomen. Patient tolerated well.   LOT: [id number] EXP: 02/2023 [id number]   Brandon Singh RMA

## 2022-06-28 ENCOUNTER — Ambulatory Visit (INDEPENDENT_AMBULATORY_CARE_PROVIDER_SITE_OTHER): Payer: Medicare HMO

## 2022-06-28 DIAGNOSIS — L400 Psoriasis vulgaris: Secondary | ICD-10-CM | POA: Diagnosis not present

## 2022-06-28 MED ORDER — ADALIMUMAB 40 MG/0.4ML ~~LOC~~ AJKT
40.0000 mg | AUTO-INJECTOR | Freq: Once | SUBCUTANEOUS | Status: AC
  Administered 2022-06-28: 40 mg via SUBCUTANEOUS

## 2022-06-28 NOTE — Progress Notes (Signed)
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    Humira 40mg /0.52mL injected into left lower abdomen. Patient tolerated well.   LOT: [id number] EXP: 10/2023 [id number]   Abby foster RMA

## 2022-07-12 ENCOUNTER — Ambulatory Visit (INDEPENDENT_AMBULATORY_CARE_PROVIDER_SITE_OTHER): Payer: Medicare HMO

## 2022-07-12 DIAGNOSIS — L400 Psoriasis vulgaris: Secondary | ICD-10-CM | POA: Diagnosis not present

## 2022-07-12 MED ORDER — ADALIMUMAB 40 MG/0.4ML ~~LOC~~ AJKT
40.0000 mg | AUTO-INJECTOR | Freq: Once | SUBCUTANEOUS | Status: AC
  Administered 2022-07-12: 40 mg via SUBCUTANEOUS

## 2022-07-12 NOTE — Progress Notes (Signed)
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    Humira 40mg /0.50mL injected into right lower abdomen. Patient tolerated well. Patient brought in injection.   LOT: [id number] EXP: 10/2023 [id number]  Evorn Gong RMA

## 2022-07-26 ENCOUNTER — Ambulatory Visit (INDEPENDENT_AMBULATORY_CARE_PROVIDER_SITE_OTHER): Payer: Medicare HMO

## 2022-07-26 ENCOUNTER — Other Ambulatory Visit: Payer: Self-pay

## 2022-07-26 DIAGNOSIS — L400 Psoriasis vulgaris: Secondary | ICD-10-CM | POA: Diagnosis not present

## 2022-07-26 DIAGNOSIS — L409 Psoriasis, unspecified: Secondary | ICD-10-CM

## 2022-07-26 MED ORDER — HUMIRA (2 PEN) 40 MG/0.4ML ~~LOC~~ AJKT: 40.0000 mg | AUTO-INJECTOR | SUBCUTANEOUS | 3 refills | Status: DC

## 2022-07-26 MED ORDER — ADALIMUMAB 40 MG/0.4ML ~~LOC~~ AJKT
40.0000 mg | AUTO-INJECTOR | Freq: Once | SUBCUTANEOUS | Status: AC
  Administered 2022-07-26: 40 mg via SUBCUTANEOUS

## 2022-07-26 NOTE — Progress Notes (Signed)
Humira Rfs sent to [facility]. aw

## 2022-07-26 NOTE — Progress Notes (Signed)
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    Humira 40mg /0.50mL injected into left lower abdomen. Patient tolerated well.   LOT: [id number] EXP: 10/2023 [id number]   Dorathy Daft, RMA

## 2022-08-09 ENCOUNTER — Ambulatory Visit (INDEPENDENT_AMBULATORY_CARE_PROVIDER_SITE_OTHER): Payer: Medicare HMO

## 2022-08-09 DIAGNOSIS — L400 Psoriasis vulgaris: Secondary | ICD-10-CM | POA: Diagnosis not present

## 2022-08-09 MED ORDER — ADALIMUMAB 40 MG/0.4ML ~~LOC~~ AJKT
40.0000 mg | AUTO-INJECTOR | Freq: Once | SUBCUTANEOUS | Status: AC
  Administered 2022-08-09: 40 mg via SUBCUTANEOUS

## 2022-08-09 NOTE — Progress Notes (Addendum)
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    Humira 40mg /0.29mL injected into right lower abdomen. Patient tolerated well. Patient supplied medication.    LOT: [id number] EXP: 10/2023 [id number]  Evorn Gong RMA

## 2022-08-24 ENCOUNTER — Ambulatory Visit (INDEPENDENT_AMBULATORY_CARE_PROVIDER_SITE_OTHER): Payer: Medicare HMO

## 2022-08-24 DIAGNOSIS — L400 Psoriasis vulgaris: Secondary | ICD-10-CM | POA: Diagnosis not present

## 2022-08-24 MED ORDER — ADALIMUMAB 40 MG/0.4ML ~~LOC~~ AJKT
40.0000 mg | AUTO-INJECTOR | Freq: Once | SUBCUTANEOUS | Status: AC
  Administered 2022-08-24: 40 mg via SUBCUTANEOUS

## 2022-08-24 NOTE — Progress Notes (Signed)
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    Humira 40mg /0.3mL injected into left lower abdomen. Patient tolerated well. Patient supplied medication.    LOT: [id number] EXP: 11/2023 [id number]   Evorn Gong RMA

## 2022-09-07 ENCOUNTER — Ambulatory Visit (INDEPENDENT_AMBULATORY_CARE_PROVIDER_SITE_OTHER): Payer: Medicare HMO

## 2022-09-07 DIAGNOSIS — L400 Psoriasis vulgaris: Secondary | ICD-10-CM | POA: Diagnosis not present

## 2022-09-07 MED ORDER — ADALIMUMAB 40 MG/0.4ML ~~LOC~~ AJKT
40.0000 mg | AUTO-INJECTOR | Freq: Once | SUBCUTANEOUS | Status: AC
  Administered 2022-09-07: 40 mg via SUBCUTANEOUS

## 2022-09-07 NOTE — Progress Notes (Signed)
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    Humira 40mg /0.72mL injected into right lower abdomen. Patient tolerated well. Patient supplied medication.    LOT: [id number] EXP: 11/2023 [id number]   Evorn Gong RMA

## 2022-09-15 ENCOUNTER — Ambulatory Visit: Payer: Medicare HMO | Admitting: Dermatology

## 2022-09-22 ENCOUNTER — Ambulatory Visit: Payer: Medicare HMO | Admitting: Dermatology

## 2022-09-27 ENCOUNTER — Ambulatory Visit: Payer: Medicare HMO | Admitting: Dermatology

## 2022-10-07 ENCOUNTER — Ambulatory Visit (INDEPENDENT_AMBULATORY_CARE_PROVIDER_SITE_OTHER): Payer: Medicare HMO | Admitting: Dermatology

## 2022-10-07 DIAGNOSIS — Z79899 Other long term (current) drug therapy: Secondary | ICD-10-CM

## 2022-10-07 DIAGNOSIS — Z7189 Other specified counseling: Secondary | ICD-10-CM | POA: Diagnosis not present

## 2022-10-07 DIAGNOSIS — L409 Psoriasis, unspecified: Secondary | ICD-10-CM

## 2022-10-07 MED ORDER — TRIAMCINOLONE ACETONIDE 0.1 % EX CREA: TOPICAL_CREAM | CUTANEOUS | 0 refills | Status: DC

## 2022-10-07 MED ORDER — ADALIMUMAB 40 MG/0.4ML ~~LOC~~ PNKT
40.0000 mg | PEN_INJECTOR | Freq: Once | SUBCUTANEOUS | Status: AC
Start: 2022-10-07 — End: 2022-10-07
  Administered 2022-10-07: 40 mg via SUBCUTANEOUS

## 2022-10-07 NOTE — Progress Notes (Signed)
   Follow-Up Visit   Subjective  Brandon Singh is a 67 y.o. male who presents for the following: Psoriasis - currently doing well on Humira SQ QOW with no s/e from medication. Some scale on the hands, back and abdomen.   The following portions of the chart were reviewed this encounter and updated as appropriate: medications, allergies, medical history  Review of Systems:  No other skin or systemic complaints except as noted in HPI or Assessment and Plan.  Objective  Well appearing patient in no apparent distress; mood and affect are within normal limits.  Areas Examined: The face, trunk, and extremities.  Relevant exam findings are noted in the Assessment and Plan.   Assessment & Plan   Psoriasis  Related Medications adalimumab (HUMIRA) pen 40 mg   Encounter for long-term (current) use of medications  Related Procedures QuantiFERON-TB Gold Plus CBC with Differential/Platelets CMP   PSORIASIS Macular hyperpigmentation and scale of the trunk and hands 10% BSA.  Chronic and persistent condition with duration or expected duration over one year. Condition is bothersome/symptomatic for patient. Currently flared.  Patient denies joint pain  Treatment Plan: Continue Humira 40mg /0.45mL SQ QOW. Humira 40mg /0.38mL injected SQ into the LLQA. Patient tolerated injection well. AL, CMA  Continue CeraVe cream moisturizer daily.   Start TMC 0.1% cream apply to aa's QD x 2 weeks to the back, hands, and abdomen. Then D/C. Topical steroids (such as triamcinolone, fluocinolone, fluocinonide, mometasone, clobetasol, halobetasol, betamethasone, hydrocortisone) can cause thinning and lightening of the skin if they are used for too long in the same area. Your physician has selected the right strength medicine for your problem and area affected on the body. Please use your medication only as directed by your physician to prevent side effects.   Counseling on psoriasis and coordination of  care  psoriasis is a chronic non-curable, but treatable genetic/hereditary disease that may have other systemic features affecting other organ systems such as joints (Psoriatic Arthritis). It is associated with an increased risk of inflammatory bowel disease, heart disease, non-alcoholic fatty liver disease, and depression.  Treatments include light and laser treatments; topical medications; and systemic medications including oral and injectables.  Annual screening labs ordered today.   Long term medication management.  Patient is using long term (months to years) prescription medication  to control their dermatologic condition.  These medications require periodic monitoring to evaluate for efficacy and side effects and may require periodic laboratory monitoring.  Return in about 2 weeks (around 10/21/2022) for nurse visit/Humira injection, 6 mths for follow up .  Maylene Roes, CMA, am acting as scribe for Armida Sans, MD .  Documentation: I have reviewed the above documentation for accuracy and completeness, and I agree with the above.  Armida Sans, MD

## 2022-10-07 NOTE — Patient Instructions (Signed)

## 2022-10-12 ENCOUNTER — Telehealth: Payer: Self-pay

## 2022-10-12 NOTE — Telephone Encounter (Signed)
Okay to enter Humira order every two weeks until follow up in February?

## 2022-10-17 ENCOUNTER — Encounter: Payer: Self-pay | Admitting: Dermatology

## 2022-10-18 MED ORDER — ADALIMUMAB 40 MG/0.4ML ~~LOC~~ PNKT
40.0000 mg | PEN_INJECTOR | SUBCUTANEOUS | Status: AC
Start: 2022-10-18 — End: 2023-04-04
  Administered 2022-11-11 – 2023-03-21 (×6): 40 mg via SUBCUTANEOUS

## 2022-10-18 NOTE — Addendum Note (Signed)
Addended by: Cari Caraway A on: 10/18/2022 11:28 AM   Modules accepted: Orders

## 2022-10-20 ENCOUNTER — Ambulatory Visit: Payer: Medicare HMO

## 2022-10-27 ENCOUNTER — Ambulatory Visit: Payer: Medicare HMO

## 2022-10-28 ENCOUNTER — Ambulatory Visit (INDEPENDENT_AMBULATORY_CARE_PROVIDER_SITE_OTHER): Payer: Medicare HMO

## 2022-10-28 DIAGNOSIS — L400 Psoriasis vulgaris: Secondary | ICD-10-CM

## 2022-10-28 MED ORDER — ADALIMUMAB 40 MG/0.4ML ~~LOC~~ PNKT
40.0000 mg | PEN_INJECTOR | Freq: Once | SUBCUTANEOUS | Status: AC
Start: 2022-10-28 — End: 2022-10-28
  Administered 2022-10-28: 40 mg via SUBCUTANEOUS

## 2022-10-28 NOTE — Progress Notes (Signed)
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    Humira 40mg /0.6mL injected into right lower abdomen. Patient tolerated well. Patient supplied medication.    LOT: [id number] EXP: 12/2023 [id number]   Evorn Gong RMA

## 2022-11-11 ENCOUNTER — Ambulatory Visit: Payer: Medicare HMO

## 2022-11-11 ENCOUNTER — Ambulatory Visit (INDEPENDENT_AMBULATORY_CARE_PROVIDER_SITE_OTHER): Payer: Medicare HMO

## 2022-11-11 DIAGNOSIS — L400 Psoriasis vulgaris: Secondary | ICD-10-CM

## 2022-11-11 NOTE — Progress Notes (Signed)
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    Humira 40mg /0.11mL injected into left lower abdomen. Patient tolerated well. Patient supplied medication.    LOT: [id number] EXP: 03/2024 [id number]   Dorathy Daft, RMA

## 2022-11-18 ENCOUNTER — Other Ambulatory Visit: Payer: Self-pay

## 2022-11-18 DIAGNOSIS — L409 Psoriasis, unspecified: Secondary | ICD-10-CM

## 2022-11-18 MED ORDER — HUMIRA (2 PEN) 40 MG/0.4ML ~~LOC~~ AJKT
40.0000 mg | AUTO-INJECTOR | SUBCUTANEOUS | 5 refills | Status: DC
Start: 2022-11-18 — End: 2023-07-19

## 2022-11-25 ENCOUNTER — Ambulatory Visit: Payer: Medicare HMO

## 2022-11-25 DIAGNOSIS — L400 Psoriasis vulgaris: Secondary | ICD-10-CM

## 2022-11-25 MED ORDER — ADALIMUMAB 40 MG/0.4ML ~~LOC~~ PNKT
40.0000 mg | PEN_INJECTOR | Freq: Once | SUBCUTANEOUS | Status: AC
Start: 2022-11-25 — End: 2022-11-25
  Administered 2022-11-25: 40 mg via SUBCUTANEOUS

## 2022-11-25 NOTE — Progress Notes (Signed)
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    Humira 40mg /0.26mL injected into right lower abdomen. Patient tolerated well. Patient supplied medication.    LOT: [id number] EXP: 03/2024 [id number]   Evorn Gong, RMA

## 2022-12-09 ENCOUNTER — Ambulatory Visit: Payer: Medicare HMO

## 2022-12-14 ENCOUNTER — Ambulatory Visit (INDEPENDENT_AMBULATORY_CARE_PROVIDER_SITE_OTHER): Payer: Medicare HMO

## 2022-12-14 DIAGNOSIS — L400 Psoriasis vulgaris: Secondary | ICD-10-CM | POA: Diagnosis not present

## 2022-12-14 NOTE — Progress Notes (Signed)
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    Humira 40mg /0.62mL injected into left lower abdomen. Patient tolerated well. Patient supplied medication.    LOT: [id number] EXP: 03/2024 [id number]  Dorathy Daft, RMA

## 2022-12-28 ENCOUNTER — Ambulatory Visit (INDEPENDENT_AMBULATORY_CARE_PROVIDER_SITE_OTHER): Payer: Medicare HMO

## 2022-12-28 DIAGNOSIS — L400 Psoriasis vulgaris: Secondary | ICD-10-CM

## 2022-12-28 NOTE — Progress Notes (Signed)
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    Humira 40mg /0.28mL injected into right lower abdomen. Patient tolerated well. Patient supplied medication.    LOT: [id number] EXP: 03/2024 [id number]   Dorathy Daft, RMA

## 2023-01-11 ENCOUNTER — Ambulatory Visit: Payer: Medicare HMO

## 2023-01-17 ENCOUNTER — Emergency Department: Payer: Medicare HMO

## 2023-01-17 ENCOUNTER — Emergency Department
Admission: EM | Admit: 2023-01-17 | Discharge: 2023-01-17 | Disposition: A | Discharge status: Home / Self Care | Payer: Medicare HMO | Attending: Student in an Organized Health Care Education/Training Program | Admitting: Student in an Organized Health Care Education/Training Program

## 2023-01-17 ENCOUNTER — Other Ambulatory Visit: Payer: Self-pay

## 2023-01-17 DIAGNOSIS — Y92410 Unspecified street and highway as the place of occurrence of the external cause: Secondary | ICD-10-CM | POA: Insufficient documentation

## 2023-01-17 DIAGNOSIS — S12190A Other displaced fracture of second cervical vertebra, initial encounter for closed fracture: Secondary | ICD-10-CM | POA: Insufficient documentation

## 2023-01-17 DIAGNOSIS — S12690A Other displaced fracture of seventh cervical vertebra, initial encounter for closed fracture: Secondary | ICD-10-CM | POA: Insufficient documentation

## 2023-01-17 DIAGNOSIS — R109 Unspecified abdominal pain: Secondary | ICD-10-CM | POA: Diagnosis not present

## 2023-01-17 DIAGNOSIS — R519 Headache, unspecified: Secondary | ICD-10-CM | POA: Insufficient documentation

## 2023-01-17 DIAGNOSIS — R0789 Other chest pain: Secondary | ICD-10-CM | POA: Insufficient documentation

## 2023-01-17 DIAGNOSIS — S129XXA Fracture of neck, unspecified, initial encounter: Secondary | ICD-10-CM

## 2023-01-17 DIAGNOSIS — M542 Cervicalgia: Secondary | ICD-10-CM | POA: Diagnosis present

## 2023-01-17 LAB — CBC WITH DIFFERENTIAL/PLATELET
Abs Immature Granulocytes: 0.08 10*3/uL — ABNORMAL HIGH (ref 0.00–0.07)
Basophils Absolute: 0.1 10*3/uL (ref 0.0–0.1)
Basophils Relative: 1 %
Eosinophils Absolute: 0.3 10*3/uL (ref 0.0–0.5)
Eosinophils Relative: 2 %
HCT: 37.9 % — ABNORMAL LOW (ref 39.0–52.0)
Hemoglobin: 12 g/dL — ABNORMAL LOW (ref 13.0–17.0)
Immature Granulocytes: 1 %
Lymphocytes Relative: 33 %
Lymphs Abs: 4.7 10*3/uL — ABNORMAL HIGH (ref 0.7–4.0)
MCH: 28.5 pg (ref 26.0–34.0)
MCHC: 31.7 g/dL (ref 30.0–36.0)
MCV: 90 fL (ref 80.0–100.0)
Monocytes Absolute: 0.9 10*3/uL (ref 0.1–1.0)
Monocytes Relative: 6 %
Neutro Abs: 8.1 10*3/uL — ABNORMAL HIGH (ref 1.7–7.7)
Neutrophils Relative %: 57 %
Platelets: 162 10*3/uL (ref 150–400)
RBC: 4.21 MIL/uL — ABNORMAL LOW (ref 4.22–5.81)
RDW: 12.7 % (ref 11.5–15.5)
WBC: 14.2 10*3/uL — ABNORMAL HIGH (ref 4.0–10.5)
nRBC: 0 % (ref 0.0–0.2)

## 2023-01-17 LAB — COMPREHENSIVE METABOLIC PANEL
ALT: 29 U/L (ref 0–44)
AST: 36 U/L (ref 15–41)
Albumin: 3.7 g/dL (ref 3.5–5.0)
Alkaline Phosphatase: 79 U/L (ref 38–126)
Anion gap: 9 (ref 5–15)
BUN: 14 mg/dL (ref 8–23)
CO2: 25 mmol/L (ref 22–32)
Calcium: 8.3 mg/dL — ABNORMAL LOW (ref 8.9–10.3)
Chloride: 105 mmol/L (ref 98–111)
Creatinine, Ser: 1.34 mg/dL — ABNORMAL HIGH (ref 0.61–1.24)
GFR, Estimated: 58 mL/min — ABNORMAL LOW (ref >60)
Glucose, Bld: 119 mg/dL — ABNORMAL HIGH (ref 70–99)
Potassium: 3.7 mmol/L (ref 3.5–5.1)
Sodium: 139 mmol/L (ref 135–145)
Total Bilirubin: 0.5 mg/dL (ref <1.2)
Total Protein: 7.5 g/dL (ref 6.5–8.1)

## 2023-01-17 LAB — LIPASE, BLOOD: Lipase: 40 U/L (ref 11–51)

## 2023-01-17 MED ORDER — IOHEXOL 350 MG/ML SOLN
100.0000 mL | Freq: Once | INTRAVENOUS | Status: AC | PRN
  Administered 2023-01-17: 100 mL via INTRAVENOUS

## 2023-01-17 MED ORDER — IOHEXOL 300 MG/ML  SOLN: 100.0000 mL | Freq: Once | INTRAMUSCULAR | Status: DC | PRN
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
## 2023-01-17 NOTE — Discharge Instructions (Signed)
You have a fracture of the transverse process of your cervical vertebrae C2.  This is a stable fracture.  You can wear the collar for comfort.  Tylenol and Motrin for pain.  Please follow-up in neurosurgery clinic in 2 weeks for reevaluation and x-rays.

## 2023-01-17 NOTE — ED Notes (Addendum)
Brandon Singh called by this RN regarding picking up this pt. Brandon Singh is this pt's caregiver. States pt does not have legal guardian.

## 2023-01-19 ENCOUNTER — Ambulatory Visit: Payer: Medicare HMO

## 2023-01-24 ENCOUNTER — Ambulatory Visit (INDEPENDENT_AMBULATORY_CARE_PROVIDER_SITE_OTHER): Payer: Medicare HMO

## 2023-01-24 DIAGNOSIS — L400 Psoriasis vulgaris: Secondary | ICD-10-CM

## 2023-01-24 NOTE — Progress Notes (Signed)
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    Humira 40mg /0.29mL injected into left lower abdomen. Patient tolerated well. Patient supplied medication.    LOT: [id number] EXP: 03/2024 [id number]   Dorathy Daft, RMA
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
## 2023-02-07 ENCOUNTER — Other Ambulatory Visit: Payer: Self-pay

## 2023-02-07 ENCOUNTER — Ambulatory Visit: Payer: Medicare HMO

## 2023-02-07 DIAGNOSIS — S12191S Other nondisplaced fracture of second cervical vertebra, sequela: Secondary | ICD-10-CM

## 2023-02-08 ENCOUNTER — Ambulatory Visit (INDEPENDENT_AMBULATORY_CARE_PROVIDER_SITE_OTHER): Payer: Medicare HMO | Admitting: Orthopedic Surgery

## 2023-02-08 ENCOUNTER — Ambulatory Visit
Admission: RE | Admit: 2023-02-08 | Discharge: 2023-02-08 | Disposition: A | Discharge status: Home / Self Care | Payer: No Typology Code available for payment source | Source: Ambulatory Visit | Attending: Orthopedic Surgery | Admitting: Orthopedic Surgery

## 2023-02-08 ENCOUNTER — Encounter: Payer: Self-pay | Admitting: Orthopedic Surgery

## 2023-02-08 ENCOUNTER — Ambulatory Visit
Admission: RE | Admit: 2023-02-08 | Discharge: 2023-02-08 | Disposition: A | Discharge status: Home / Self Care | Payer: No Typology Code available for payment source | Attending: Orthopedic Surgery | Admitting: Orthopedic Surgery

## 2023-02-08 DIAGNOSIS — S12191S Other nondisplaced fracture of second cervical vertebra, sequela: Secondary | ICD-10-CM | POA: Insufficient documentation

## 2023-02-08 DIAGNOSIS — S129XXA Fracture of neck, unspecified, initial encounter: Secondary | ICD-10-CM

## 2023-02-14 ENCOUNTER — Ambulatory Visit: Payer: Medicare HMO

## 2023-02-21 ENCOUNTER — Ambulatory Visit: Payer: Medicare HMO

## 2023-02-21 DIAGNOSIS — L409 Psoriasis, unspecified: Secondary | ICD-10-CM | POA: Diagnosis not present

## 2023-02-21 MED ORDER — ADALIMUMAB 40 MG/0.4ML ~~LOC~~ AJKT
40.0000 mg | AUTO-INJECTOR | Freq: Once | SUBCUTANEOUS | Status: AC
  Administered 2023-02-21: 40 mg via SUBCUTANEOUS

## 2023-02-21 NOTE — Progress Notes (Signed)
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    Humira 40mg /0.53mL injected into right lower abdomen. Patient tolerated well. Patient supplied medication.    LOT: [id number] EXP: 03/2024 [id number]   Cari Caraway CMA, AAMA

## 2023-02-21 NOTE — Patient Instructions (Signed)

## 2023-02-28 ENCOUNTER — Telehealth: Payer: Self-pay | Admitting: Orthopedic Surgery

## 2023-02-28 NOTE — Telephone Encounter (Signed)
 Attempted to call Mr.Benjie Karvonen, care taker for Mr. Sparr to relay Stacy's message. Left a voicemail for him to call our office back.

## 2023-02-28 NOTE — Telephone Encounter (Signed)
 No pain at his visit and his cervical xrays show no instability.   He can d/c collar and follow up with me prn.   Please call his caretaker Benjie Karvonen and let him know. His number is in the chart.

## 2023-03-01 NOTE — Telephone Encounter (Signed)
 I left voicemail with Stacy's message for Brandon Singh (care taker for Brandon Singh). The patient gave permission in his chart for detailed messages via home phone or cell phone listed for Brandon. Singh. This is listed in the FYI section of this chart. I told them to call our office if they had any questions or concerns.

## 2023-03-07 ENCOUNTER — Ambulatory Visit: Payer: Medicare HMO

## 2023-03-07 DIAGNOSIS — L409 Psoriasis, unspecified: Secondary | ICD-10-CM

## 2023-03-07 NOTE — Progress Notes (Signed)
 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    Humira 40mg /0.75mL injected into left lower abdomen. Patient tolerated well. Sample supplied medication.    LOT: [id number] EXP: 03/25/2023 [id number]   Dorathy Daft, RMA

## 2023-03-21 ENCOUNTER — Ambulatory Visit: Payer: Medicare HMO

## 2023-03-21 DIAGNOSIS — L400 Psoriasis vulgaris: Secondary | ICD-10-CM | POA: Diagnosis not present

## 2023-03-21 NOTE — Progress Notes (Signed)
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    Humira 40mg /0.40mL injected into right lower abdomen. Patient tolerated well. Sample supplied medication.    LOT: [id number] EXP: 05/23/2024 [id number]   Dorathy Daft, RMA

## 2023-04-04 ENCOUNTER — Ambulatory Visit (INDEPENDENT_AMBULATORY_CARE_PROVIDER_SITE_OTHER): Payer: Medicare HMO

## 2023-04-04 DIAGNOSIS — L400 Psoriasis vulgaris: Secondary | ICD-10-CM | POA: Diagnosis not present

## 2023-04-04 DIAGNOSIS — L732 Hidradenitis suppurativa: Secondary | ICD-10-CM

## 2023-04-04 MED ORDER — ADALIMUMAB 40 MG/0.8ML ~~LOC~~ AJKT
40.0000 mg | AUTO-INJECTOR | Freq: Once | SUBCUTANEOUS | Status: AC
  Administered 2023-04-04: 40 mg via SUBCUTANEOUS

## 2023-04-04 NOTE — Progress Notes (Signed)
 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    Humira  40mg /0.47mL injected into left lower abdomen. Patient tolerated well. Sample supplied medication.    LOT: [id number] EXP: 05/23/2024 [id number]   Mara Seminole CMA, AAMA

## 2023-04-18 ENCOUNTER — Ambulatory Visit: Payer: Medicare HMO

## 2023-04-20 ENCOUNTER — Encounter: Payer: Self-pay | Admitting: Dermatology

## 2023-04-20 ENCOUNTER — Ambulatory Visit (INDEPENDENT_AMBULATORY_CARE_PROVIDER_SITE_OTHER): Payer: Medicare HMO | Admitting: Dermatology

## 2023-04-20 ENCOUNTER — Ambulatory Visit: Payer: Medicare HMO | Admitting: Dermatology

## 2023-04-20 DIAGNOSIS — L819 Disorder of pigmentation, unspecified: Secondary | ICD-10-CM

## 2023-04-20 DIAGNOSIS — L853 Xerosis cutis: Secondary | ICD-10-CM

## 2023-04-20 DIAGNOSIS — Z79899 Other long term (current) drug therapy: Secondary | ICD-10-CM

## 2023-04-20 DIAGNOSIS — L409 Psoriasis, unspecified: Secondary | ICD-10-CM

## 2023-04-20 DIAGNOSIS — Z7189 Other specified counseling: Secondary | ICD-10-CM | POA: Diagnosis not present

## 2023-04-20 MED ORDER — ADALIMUMAB 40 MG/0.4ML ~~LOC~~ AJKT
40.0000 mg | AUTO-INJECTOR | SUBCUTANEOUS | Status: AC
  Administered 2023-04-20 – 2023-09-27 (×10): 40 mg via SUBCUTANEOUS

## 2023-04-20 NOTE — Progress Notes (Signed)
 Follow-Up Visit   Subjective  Brandon Singh is a 68 y.o. male who presents for the following: Psoriasis trunk, extremities, 6m f/u Humira sq injections qow, TMC 0.1% cr prn flares The patient has spots, moles and lesions to be evaluated, some may be new or changing and the patient may have concern these could be cancer.  Patient accompanied by caregiver.  The following portions of the chart were reviewed this encounter and updated as appropriate: medications, allergies, medical history  Review of Systems:  No other skin or systemic complaints except as noted in HPI or Assessment and Plan.  Objective  Well appearing patient in no apparent distress; mood and affect are within normal limits.   A focused examination was performed of the following areas: Trunk, arms, face, legs  Relevant exam findings are noted in the Assessment and Plan.   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 Chronic and persistent condition with duration or expected duration over one year. Condition is improving with treatment but not currently at goal.  patient denies joint pain  Labs 09/27/22, plan to have yearly biologic labs at next visit  Psoriasis - severe on systemic treatment.  Psoriasis is a chronic non-curable, but treatable genetic/hereditary disease that may have other systemic features affecting other organ systems such as joints (Psoriatic Arthritis).  It is linked with heart disease, inflammatory bowel disease, non-alcoholic fatty liver disease, and depression. Significant skin psoriasis and/or psoriatic arthritis may have significant symptoms and affects activities of daily activity and often benefits from systemic treatments.  These systemic treatments have some potential side effects including  immunosuppression and require pre-treatment laboratory screening and periodic laboratory monitoring and periodic in person evaluation and monitoring by the attending dermatologist physician (long term medication management).   Reviewed risks of biologics including immunosuppression, infections, injection site reaction, and failure to improve condition. Goal is control of skin condition, not cure.  Some older biologics such as Humira and Enbrel may slightly increase risk of malignancy and may worsen congestive heart failure.  Taltz and Cosentyx may cause inflammatory bowel disease to flare. The use of biologics requires long term medication management, including periodic office visits and monitoring of blood work.   Treatment Plan: Cont Humira 40mg /0.6ml sq injections qow Humira 40mg /0.67ml sq injection to R lower abdomen Lot [id number] exp 06/2024 Cont TMC 0.1% cr up to 2 wks prn flares Cont mild soap and moisturizer qd Long term medication management.  Patient is using long term (months to years) prescription medication  to control their dermatologic condition.  These medications require periodic monitoring to evaluate for efficacy and side effects and may require periodic laboratory monitoring.    Topical steroids (such as triamcinolone, fluocinolone, fluocinonide, mometasone, clobetasol, halobetasol, betamethasone, hydrocortisone) can cause thinning and lightening of the skin if they are used for too long in the same area. Your physician has selected the right strength medicine for your problem and area affected on the body. Please use your medication only as directed by your physician to prevent side effects.   Xerosis - diffuse xerotic patches - recommend gentle, hydrating skin care - gentle skin care handout given  PSORIASIS   Related Medications adalimumab (HUMIRA, 2 PEN,) 40 MG/0.4ML pen Inject 0.4 mLs (40 mg total) into the skin every 14 (fourteen) days. For maintenance. adalimumab  (HUMIRA) pen 40 mg  DYSCHROMIA  Return in about 1 week (around 04/27/2023) for nurse Humira injection, 74m Psorisis f/u.  I, Ardis Rowan, RMA, am acting as scribe for Armida Sans, MD .   Documentation: I have reviewed the above documentation for accuracy and completeness, and I agree with the above.  Armida Sans, MD

## 2023-04-20 NOTE — Patient Instructions (Signed)

## 2023-05-04 ENCOUNTER — Ambulatory Visit (INDEPENDENT_AMBULATORY_CARE_PROVIDER_SITE_OTHER): Payer: Medicare HMO

## 2023-05-04 DIAGNOSIS — L400 Psoriasis vulgaris: Secondary | ICD-10-CM | POA: Diagnosis not present

## 2023-05-04 NOTE — Progress Notes (Signed)
 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    Humira 40mg /0.36mL injected into left lower abdomen. Patient tolerated well. Sample supplied medication.    LOT: [id number] EXP: 06/22/2024 [id number]   Dorathy Daft, RMA

## 2023-05-18 ENCOUNTER — Ambulatory Visit (INDEPENDENT_AMBULATORY_CARE_PROVIDER_SITE_OTHER)

## 2023-05-18 DIAGNOSIS — L409 Psoriasis, unspecified: Secondary | ICD-10-CM | POA: Diagnosis not present

## 2023-05-18 NOTE — Progress Notes (Signed)
 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    Humira 40mg /0.74mL injected into right lower abdomen. Patient tolerated well. Sample supplied medication.    LOT: [id number] EXP: 07/23/2024 [id number]   Dorathy Daft, RMA

## 2023-05-31 ENCOUNTER — Ambulatory Visit

## 2023-05-31 DIAGNOSIS — Z79899 Other long term (current) drug therapy: Secondary | ICD-10-CM

## 2023-05-31 DIAGNOSIS — L409 Psoriasis, unspecified: Secondary | ICD-10-CM

## 2023-05-31 DIAGNOSIS — L400 Psoriasis vulgaris: Secondary | ICD-10-CM | POA: Diagnosis not present

## 2023-05-31 MED ORDER — ADALIMUMAB 40 MG/0.4ML ~~LOC~~ AJKT
40.0000 mg | AUTO-INJECTOR | Freq: Once | SUBCUTANEOUS | Status: AC
  Administered 2023-05-31: 40 mg via SUBCUTANEOUS

## 2023-05-31 NOTE — Progress Notes (Signed)
 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    Humira 40mg /0.6mL injected into left lower abdomen. Patient tolerated well. Sample supplied medication.    LOT: [id number] EXP: 07/23/2024 [id number]  Addison Bailey, RMA

## 2023-06-01 ENCOUNTER — Ambulatory Visit

## 2023-06-14 ENCOUNTER — Ambulatory Visit

## 2023-06-15 ENCOUNTER — Ambulatory Visit

## 2023-06-15 DIAGNOSIS — L400 Psoriasis vulgaris: Secondary | ICD-10-CM | POA: Diagnosis not present

## 2023-06-15 NOTE — Progress Notes (Signed)
 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    Humira  40mg /0.79mL injected into right lower abdomen. Patient tolerated well. Sample supplied medication.    LOT: [id number] EXP: 12/23/2023 [id number]   Cooper Denver, RMA

## 2023-06-29 ENCOUNTER — Ambulatory Visit

## 2023-06-30 ENCOUNTER — Ambulatory Visit

## 2023-06-30 DIAGNOSIS — L400 Psoriasis vulgaris: Secondary | ICD-10-CM | POA: Diagnosis not present

## 2023-06-30 NOTE — Progress Notes (Signed)
 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    Humira  40mg /0.50mL injected into left lower abdomen. Patient tolerated well. Sample supplied medication.    LOT: [id number] EXP: 07/2024 [id number]   Lisbeth Rides, RMA

## 2023-07-14 ENCOUNTER — Ambulatory Visit (INDEPENDENT_AMBULATORY_CARE_PROVIDER_SITE_OTHER)

## 2023-07-14 DIAGNOSIS — L400 Psoriasis vulgaris: Secondary | ICD-10-CM

## 2023-07-14 NOTE — Progress Notes (Signed)
 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    Humira  40mg /0.46mL injected into right lower abdomen. Patient tolerated well. Sample supplied medication.    LOT: [id number]  EXP: 07/2024 NDC: [id number]     Brandon Singh V. Grier Leber, CMA

## 2023-07-19 ENCOUNTER — Other Ambulatory Visit: Payer: Self-pay

## 2023-07-19 DIAGNOSIS — L409 Psoriasis, unspecified: Secondary | ICD-10-CM

## 2023-07-19 MED ORDER — HUMIRA (2 PEN) 40 MG/0.4ML ~~LOC~~ AJKT: 40.0000 mg | AUTO-INJECTOR | SUBCUTANEOUS | 5 refills | Status: DC

## 2023-07-28 ENCOUNTER — Ambulatory Visit

## 2023-07-28 DIAGNOSIS — L400 Psoriasis vulgaris: Secondary | ICD-10-CM

## 2023-07-28 NOTE — Progress Notes (Signed)
 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    Humira  40mg /0.43mL injected into left lower abdomen. Patient tolerated well. Sample supplied medication.    LOT: [id number]   EXP: 08/2024 NDC: [id number]      Lisbeth Rides, RMA

## 2023-08-11 ENCOUNTER — Ambulatory Visit

## 2023-08-25 ENCOUNTER — Ambulatory Visit

## 2023-08-25 DIAGNOSIS — L400 Psoriasis vulgaris: Secondary | ICD-10-CM

## 2023-08-25 NOTE — Progress Notes (Signed)
 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    Humira  40mg /0.26mL injected into right lower abdomen. Patient tolerated well. Sample supplied medication.    LOT: [id number]   EXP: 08/2024 NDC: 9925-9445-97      Alan Pizza, RMA

## 2023-09-08 ENCOUNTER — Ambulatory Visit

## 2023-09-08 DIAGNOSIS — L400 Psoriasis vulgaris: Secondary | ICD-10-CM

## 2023-09-08 NOTE — Progress Notes (Signed)
 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    Humira  40mg /0.42mL injected into left lower abdomen. Patient tolerated well. Sample supplied medication.    LOT: [id number]   EXP: 08/2024 NDC: 9925-9445-97      Alan Pizza, RMA

## 2023-09-22 ENCOUNTER — Ambulatory Visit

## 2023-09-27 ENCOUNTER — Ambulatory Visit

## 2023-09-27 DIAGNOSIS — L409 Psoriasis, unspecified: Secondary | ICD-10-CM

## 2023-09-27 NOTE — Progress Notes (Signed)
 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    Humira  40mg /0.69mL injected into right lower abdomen. Patient tolerated well. Sample supplied medication.    LOT: [id number]   EXP: 08/2024 NDC: 9925-9445-97      Alan Pizza, RMA

## 2023-10-11 ENCOUNTER — Ambulatory Visit

## 2023-10-11 DIAGNOSIS — L409 Psoriasis, unspecified: Secondary | ICD-10-CM

## 2023-10-11 MED ORDER — ADALIMUMAB 40 MG/0.4ML ~~LOC~~ AJKT
40.0000 mg | AUTO-INJECTOR | SUBCUTANEOUS | Status: AC
  Administered 2023-10-11: 40 mg via SUBCUTANEOUS

## 2023-10-11 NOTE — Progress Notes (Signed)
 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    Humira  40mg /0.34mL injected into left lower abdomen. Patient tolerated well. Sample supplied medication.    LOT: [id number]   EXP: 11/2024 NDC: 9925-9445-97      Alan Pizza, RMA

## 2023-10-25 ENCOUNTER — Ambulatory Visit

## 2023-10-27 ENCOUNTER — Ambulatory Visit: Payer: Medicare HMO | Admitting: Dermatology

## 2023-11-14 ENCOUNTER — Ambulatory Visit (INDEPENDENT_AMBULATORY_CARE_PROVIDER_SITE_OTHER): Admitting: Dermatology

## 2023-11-14 ENCOUNTER — Encounter: Payer: Self-pay | Admitting: Dermatology

## 2023-11-14 DIAGNOSIS — Z79899 Other long term (current) drug therapy: Secondary | ICD-10-CM | POA: Diagnosis not present

## 2023-11-14 DIAGNOSIS — L409 Psoriasis, unspecified: Secondary | ICD-10-CM

## 2023-11-14 DIAGNOSIS — Z7189 Other specified counseling: Secondary | ICD-10-CM | POA: Diagnosis not present

## 2023-11-14 DIAGNOSIS — L819 Disorder of pigmentation, unspecified: Secondary | ICD-10-CM

## 2023-11-14 MED ORDER — ADALIMUMAB 40 MG/0.4ML ~~LOC~~ AJKT
40.0000 mg | AUTO-INJECTOR | SUBCUTANEOUS | Status: AC
  Administered 2023-11-14 – 2024-03-27 (×9): 40 mg via SUBCUTANEOUS

## 2023-11-14 NOTE — Patient Instructions (Addendum)
 Cont Humira  40mg /0.34ml sq injections every other week.   TB ordered today. Recommend having TB performed once yearly at PCP.   Reviewed risks of biologics including immunosuppression, infections (i.e. TB reactivation), injection site reaction, and failure to improve condition. Goal is control of skin condition, not cure.  Some older biologics such as Humira  and Enbrel may slightly increase risk of malignancy and may worsen congestive heart failure.  Taltz, Cosentyx, and Bimzelx may cause inflammatory bowel disease to flare or may increase incidence of yeast infections. Skyrizi, Tremfya, and Stelara may also slightly increase risk of infection. The use of biologics requires long term medication management, including periodic office visits, annual TB screening test and monitoring of blood work.    Due to recent changes in healthcare laws, you may see results of your pathology and/or laboratory studies on MyChart before the doctors have had a chance to review them. We understand that in some cases there may be results that are confusing or concerning to you. Please understand that not all results are received at the same time and often the doctors may need to interpret multiple results in order to provide you with the best plan of care or course of treatment. Therefore, we ask that you please give us  2 business days to thoroughly review all your results before contacting the office for clarification. Should we see a critical lab result, you will be contacted sooner.   If You Need Anything After Your Visit  If you have any questions or concerns for your doctor, please call our main line at [phone] and press option 4 to reach your doctor's medical assistant. If no one answers, please leave a voicemail as directed and we will return your call as soon as possible. Messages left after 4 pm will be answered the following business day.   You may also send us  a message via MyChart. We typically respond to  MyChart messages within 1-2 business days.  For prescription refills, please ask your pharmacy to contact our office. Our fax number is [phone].  If you have an urgent issue when the clinic is closed that cannot wait until the next business day, you can page your doctor at the number below.    Please note that while we do our best to be available for urgent issues outside of office hours, we are not available 24/7.   If you have an urgent issue and are unable to reach us , you may choose to seek medical care at your doctor's office, retail clinic, urgent care center, or emergency room.  If you have a medical emergency, please immediately call 911 or go to the emergency department.  Pager Numbers  - Dr. Hester: [phone]  - Dr. Jackquline: [phone]  - Dr. Claudene: [phone]   - Dr. Raymund: [phone]  In the event of inclement weather, please call our main line at [phone] for an update on the status of any delays or closures.  Dermatology Medication Tips: Please keep the boxes that topical medications come in in order to help keep track of the instructions about where and how to use these. Pharmacies typically print the medication instructions only on the boxes and not directly on the medication tubes.   If your medication is too expensive, please contact our office at [phone] option 4 or send us  a message through MyChart.   We are unable to tell what your co-pay for medications will be in advance as this is different depending on your insurance coverage. However,  we may be able to find a substitute medication at lower cost or fill out paperwork to get insurance to cover a needed medication.   If a prior authorization is required to get your medication covered by your insurance company, please allow us  1-2 business days to complete this process.  Drug prices often vary depending on where the prescription is filled and some pharmacies may offer cheaper  prices.  The website www.goodrx.com contains coupons for medications through different pharmacies. The prices here do not account for what the cost may be with help from insurance (it may be cheaper with your insurance), but the website can give you the price if you did not use any insurance.  - You can print the associated coupon and take it with your prescription to the pharmacy.  - You may also stop by our office during regular business hours and pick up a GoodRx coupon card.  - If you need your prescription sent electronically to a different pharmacy, notify our office through [facility] or by phone at [phone] option 4.     Si Usted Necesita Algo Despus de Su Visita  Tambin puede enviarnos un mensaje a travs de Clinical cytogeneticist. Por lo general respondemos a los mensajes de MyChart en el transcurso de 1 a 2 das hbiles.  Para renovar recetas, por favor pida a su farmacia que se ponga en contacto con nuestra oficina. Randi lakes de fax es [place] [phone].  Si tiene un asunto urgente cuando la clnica est cerrada y que no puede esperar hasta el siguiente da hbil, puede llamar/localizar a su doctor(a) al nmero que aparece a continuacin.   Por favor, tenga en cuenta que aunque hacemos todo lo posible para estar disponibles para asuntos urgentes fuera del horario de [place], no estamos disponibles las 24 horas del da, los 7 [address]  [address] de la [place].   Si tiene un problema urgente y no puede comunicarse con nosotros, puede optar por buscar atencin mdica  en el consultorio de su doctor(a), en una clnica privada, en un centro de atencin urgente o en una sala de emergencias.  Si tiene Engineer, drilling, por favor llame inmediatamente al 911 o vaya a la sala de emergencias.  Nmeros de bper  - Dr. Hester: [phone]  - Dra. Jackquline: [phone]  - Dr. Claudene: [phone]  - Dra. Kitts: [phone]  En caso de inclemencias del [place], por favor llame a nuestra  lnea principal al [phone] para una actualizacin sobre el estado de cualquier retraso o cierre.  Consejos para la medicacin en dermatologa: Por favor, guarde las cajas en las que vienen los medicamentos de uso tpico para ayudarle a seguir las instrucciones sobre dnde y cmo usarlos. Las farmacias generalmente imprimen las instrucciones del medicamento slo en las cajas y no directamente en los tubos del [place].   Si su medicamento es muy caro, por favor, pngase en contacto con landry rieger llamando al [phone] y presione la opcin 4 o envenos un mensaje a travs de Clinical cytogeneticist.   No podemos decirle cul ser su copago por los medicamentos por adelantado ya que esto es diferente dependiendo de la cobertura de su seguro. Sin embargo, es posible que podamos encontrar un medicamento sustituto a Audiological scientist un formulario para que el seguro cubra el medicamento que se considera necesario.   Si se requiere una autorizacin previa para que su compaa de seguros malta su medicamento, por favor permtanos de 1 a 2 das hbiles para completar este proceso.  Los precios de los medicamentos varan con frecuencia dependiendo del Environmental consultant de dnde se surte la receta y alguna farmacias pueden ofrecer precios ms baratos.  El sitio web www.goodrx.com tiene cupones para medicamentos de Health and safety inspector. Los precios aqu no tienen en cuenta lo que podra costar con la ayuda del seguro (puede ser ms barato con su seguro), pero el sitio web puede darle el precio si no utiliz Tourist information centre manager.  - Puede imprimir el cupn correspondiente y llevarlo con su receta a la farmacia.  - Tambin puede pasar por nuestra oficina durante el horario de atencin regular y Education officer, museum una tarjeta de cupones de GoodRx.  - Si necesita que su receta se enve electrnicamente a una farmacia diferente, informe a nuestra oficina a travs de MyChart de  o por telfono llamando al [phone] y presione la  opcin 4.

## 2023-11-14 NOTE — Progress Notes (Signed)
 Follow-Up Visit   Subjective  Brandon Singh is a 68 y.o. male who presents for the following: Psoriasis. 6 month follow-up. Humira  40 mg/0.4 ml subcutaneous injection every 2 weeks. Missed last injection.   This patient is accompanied in the office by his caregiver who contributes to history.  The following portions of the chart were reviewed this encounter and updated as appropriate: medications, allergies, medical history  Review of Systems:  No other skin or systemic complaints except as noted in HPI or Assessment and Plan.  Objective  Well appearing patient in no apparent distress; mood and affect are within normal limits.  Areas Examined: Torso, extremities   Relevant exam findings are noted in the Assessment and Plan.   Assessment & Plan   PSORIASIS   Related Procedures QuantiFERON-TB Gold Plus Related Medications adalimumab  (HUMIRA , 2 PEN,) 40 MG/0.4ML pen Inject 0.4 mLs (40 mg total) into the skin every 14 (fourteen) days. For maintenance. adalimumab  (HUMIRA ) pen 40 mg  LONG-TERM USE OF HIGH-RISK MEDICATION   Related Procedures QuantiFERON-TB Gold Plus  PSORIASIS on systemic treatment Minimal spotty hyperpigmentation at torso and extremities. 6% BSA.  Chronic and persistent condition with duration or expected duration over one year. Condition is improving with treatment but not currently at goal.  Counseling and coordination of care for severe psoriasis on systemic treatment  Psoriasis - severe on systemic treatment.  Psoriasis is a chronic non-curable, but treatable genetic/hereditary disease that may have other systemic features affecting other organ systems such as joints (Psoriatic Arthritis).  It is linked with heart disease, inflammatory bowel disease, non-alcoholic fatty liver disease, and depression. Significant skin psoriasis and/or psoriatic arthritis may have significant symptoms and affects activities of daily activity and often benefits from  systemic treatments.  These systemic treatments have some potential side effects including immunosuppression and require pre-treatment laboratory screening and periodic laboratory monitoring and periodic in person evaluation and monitoring by the attending dermatologist physician (long term medication management).   Patient denies joint pain  TB ordered today. Recommend having TB performed once yearly at PCP if they are OK with that to save pt an extra blood draw.  Treatment Plan: Cont Humira  40mg /0.30ml sq injections qow Humira  40mg /0.44ml sq injection to R lower abdomen Lot [id number] exp 11/2024 Cont TMC 0.1% cr up to 2 wks prn flares Cont mild soap and moisturizer qd  Reviewed risks of biologics including immunosuppression, infections (i.e. TB reactivation), injection site reaction, and failure to improve condition. Goal is control of skin condition, not cure.  Some older biologics such as Humira  and Enbrel may slightly increase risk of malignancy and may worsen congestive heart failure.  Taltz, Cosentyx, and Bimzelx may cause inflammatory bowel disease to flare or may increase incidence of yeast infections. Skyrizi, Tremfya, and Stelara may also slightly increase risk of infection. The use of biologics requires long term medication management, including periodic office visits, annual TB screening test and monitoring of blood work.   Long term medication management.  Patient is using long term (months to years) prescription medication  to control their dermatologic condition.  These medications require periodic monitoring to evaluate for efficacy and side effects and may require periodic laboratory monitoring.   Return in about 6 months (around 05/13/2024) for Psoriasis Follow Up, 2 weeks , Injection On Nurse Schedule.  I, Jill Parcell, CMA, am acting as scribe for Alm Rhyme, MD.   Documentation: I have reviewed the above documentation for accuracy and completeness, and I agree with the  above.  Alm Rhyme, MD

## 2023-11-28 ENCOUNTER — Ambulatory Visit

## 2023-12-01 ENCOUNTER — Ambulatory Visit

## 2023-12-01 DIAGNOSIS — L400 Psoriasis vulgaris: Secondary | ICD-10-CM

## 2023-12-01 NOTE — Progress Notes (Signed)
 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    Humira  40mg /0.70mL injected into left lower abdomen. Patient tolerated well. Sample supplied medication.    LOT: [id number]   EXP: 01/2025 NDC: 9925-9445-97      Alan Pizza, RMA

## 2023-12-14 ENCOUNTER — Ambulatory Visit

## 2023-12-14 DIAGNOSIS — L400 Psoriasis vulgaris: Secondary | ICD-10-CM | POA: Diagnosis not present

## 2023-12-14 NOTE — Progress Notes (Signed)
 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    Humira  40mg /0.33mL injected into right lower abdomen. Patient tolerated well.    LOT: [id number]   EXP: 01/2025 NDC: 9925-9445-97      Alan Pizza, RMA

## 2023-12-28 ENCOUNTER — Ambulatory Visit

## 2023-12-28 DIAGNOSIS — L400 Psoriasis vulgaris: Secondary | ICD-10-CM

## 2023-12-28 NOTE — Progress Notes (Signed)
 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    Humira  40mg /0.74mL injected into left lower abdomen. Patient tolerated well.    LOT: [id number]   EXP: 05/2025 NDC: 9925-9445-97      Alan Pizza, RMA

## 2024-01-11 ENCOUNTER — Ambulatory Visit

## 2024-01-11 DIAGNOSIS — L400 Psoriasis vulgaris: Secondary | ICD-10-CM | POA: Diagnosis not present

## 2024-01-11 NOTE — Progress Notes (Signed)
 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    Humira  40mg /0.8mL injected into right lower abdomen. Patient tolerated well.    LOT: [id number]   EXP: 05/2025 NDC: 9925-9445-97      Alan Pizza, RMA

## 2024-01-25 ENCOUNTER — Ambulatory Visit

## 2024-01-25 DIAGNOSIS — L400 Psoriasis vulgaris: Secondary | ICD-10-CM

## 2024-01-25 NOTE — Progress Notes (Signed)
 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    Humira  40mg /0.50mL injected into left lower abdomen. Patient tolerated well.    LOT: [id number]   EXP: 05/2025 NDC: 9925-9445-97      Alan Pizza, RMA

## 2024-02-08 ENCOUNTER — Ambulatory Visit

## 2024-02-08 DIAGNOSIS — L400 Psoriasis vulgaris: Secondary | ICD-10-CM | POA: Diagnosis not present

## 2024-02-08 NOTE — Progress Notes (Signed)
 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    Humira  40mg /0.51mL injected into right lower abdomen. Patient tolerated well.    LOT: [id number]   EXP: 05/2025 NDC: 9925-9445-97      Alan Pizza, RMA

## 2024-02-21 ENCOUNTER — Ambulatory Visit

## 2024-03-08 ENCOUNTER — Other Ambulatory Visit: Payer: Self-pay

## 2024-03-08 DIAGNOSIS — L409 Psoriasis, unspecified: Secondary | ICD-10-CM

## 2024-03-08 MED ORDER — HUMIRA (2 PEN) 40 MG/0.4ML ~~LOC~~ AJKT: 40.0000 mg | AUTO-INJECTOR | SUBCUTANEOUS | 5 refills | Status: AC

## 2024-03-13 ENCOUNTER — Ambulatory Visit

## 2024-03-13 DIAGNOSIS — L400 Psoriasis vulgaris: Secondary | ICD-10-CM

## 2024-03-13 NOTE — Progress Notes (Signed)
 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    Humira  40mg /0.27mL injected into left lower abdomen. Patient tolerated well.    LOT: [id number]   EXP: 05/23/2025 NDC: 9925-9445-97      Alan Pizza, RMA

## 2024-03-27 ENCOUNTER — Ambulatory Visit

## 2024-03-27 DIAGNOSIS — L400 Psoriasis vulgaris: Secondary | ICD-10-CM

## 2024-03-27 NOTE — Progress Notes (Signed)
 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    Humira  40mg /0.72mL injected into right lower abdomen. Patient tolerated well.    LOT: [id number]   EXP: 07/23/2025 NDC: 9925-9445-97      Alan Pizza, RMA

## 2024-04-10 ENCOUNTER — Ambulatory Visit
